# Patient Record
Sex: Female | Born: 1965
Health system: Southern US, Community
[De-identification: ages and names within clinical notes are randomized; demographics above are authoritative.]

## PROBLEM LIST (undated history)

## (undated) DIAGNOSIS — J302 Other seasonal allergic rhinitis: Secondary | ICD-10-CM

## (undated) DIAGNOSIS — T4145XA Adverse effect of unspecified anesthetic, initial encounter: Secondary | ICD-10-CM

## (undated) DIAGNOSIS — I499 Cardiac arrhythmia, unspecified: Secondary | ICD-10-CM

---

## 1985-07-17 HISTORY — PX: OTHER SURGICAL HISTORY: SHX169

## 1993-07-17 DIAGNOSIS — T8859XA Other complications of anesthesia, initial encounter: Secondary | ICD-10-CM

## 1993-07-17 HISTORY — DX: Other complications of anesthesia, initial encounter: T88.59XA

## 1993-07-17 HISTORY — PX: MANDIBLE FRACTURE SURGERY: SHX706

## 1999-06-13 ENCOUNTER — Encounter: Payer: Self-pay | Admitting: Sports Medicine

## 1999-06-13 ENCOUNTER — Encounter: Admission: RE | Admit: 1999-06-13 | Discharge: 1999-06-13 | Payer: Self-pay | Admitting: Sports Medicine

## 1999-06-22 ENCOUNTER — Encounter: Payer: Self-pay | Admitting: Sports Medicine

## 1999-06-22 ENCOUNTER — Encounter: Admission: RE | Admit: 1999-06-22 | Discharge: 1999-06-22 | Payer: Self-pay | Admitting: Sports Medicine

## 1999-12-01 ENCOUNTER — Encounter: Payer: Self-pay | Admitting: Sports Medicine

## 1999-12-01 ENCOUNTER — Encounter: Admission: RE | Admit: 1999-12-01 | Discharge: 1999-12-01 | Payer: Self-pay | Admitting: Sports Medicine

## 1999-12-22 ENCOUNTER — Encounter: Admission: RE | Admit: 1999-12-22 | Discharge: 1999-12-22 | Payer: Self-pay | Admitting: Family Medicine

## 1999-12-22 ENCOUNTER — Encounter: Payer: Self-pay | Admitting: Family Medicine

## 2000-07-03 ENCOUNTER — Encounter: Admission: RE | Admit: 2000-07-03 | Discharge: 2000-07-03 | Payer: Self-pay | Admitting: Family Medicine

## 2000-07-03 ENCOUNTER — Encounter: Payer: Self-pay | Admitting: Family Medicine

## 2001-04-12 ENCOUNTER — Encounter: Admission: RE | Admit: 2001-04-12 | Discharge: 2001-04-12 | Payer: Self-pay

## 2002-06-16 ENCOUNTER — Encounter: Payer: Self-pay | Admitting: Sports Medicine

## 2002-06-16 ENCOUNTER — Encounter: Admission: RE | Admit: 2002-06-16 | Discharge: 2002-06-16 | Payer: Self-pay | Admitting: Sports Medicine

## 2002-06-16 ENCOUNTER — Encounter: Admission: RE | Admit: 2002-06-16 | Discharge: 2002-06-16 | Payer: Self-pay | Admitting: Family Medicine

## 2003-08-26 ENCOUNTER — Encounter: Admission: RE | Admit: 2003-08-26 | Discharge: 2003-08-26 | Payer: Self-pay | Admitting: Sports Medicine

## 2004-12-22 ENCOUNTER — Encounter: Admission: RE | Admit: 2004-12-22 | Discharge: 2004-12-22 | Payer: Self-pay | Admitting: Sports Medicine

## 2004-12-29 ENCOUNTER — Encounter: Admission: RE | Admit: 2004-12-29 | Discharge: 2004-12-29 | Payer: Self-pay | Admitting: Sports Medicine

## 2006-01-02 ENCOUNTER — Encounter: Admission: RE | Admit: 2006-01-02 | Discharge: 2006-01-02 | Payer: Self-pay | Admitting: Obstetrics and Gynecology

## 2007-01-04 ENCOUNTER — Encounter: Admission: RE | Admit: 2007-01-04 | Discharge: 2007-01-04 | Payer: Self-pay | Admitting: Obstetrics and Gynecology

## 2007-05-20 ENCOUNTER — Encounter: Payer: Self-pay | Admitting: Sports Medicine

## 2007-05-20 LAB — CONVERTED CEMR LAB
Alkaline Phosphatase: 54 units/L (ref 39–117)
Chloride: 105 meq/L (ref 96–112)
Cholesterol: 178 mg/dL (ref 0–200)
Hemoglobin: 13.8 g/dL (ref 12.0–15.0)
LDL Cholesterol: 105 mg/dL — ABNORMAL HIGH (ref 0–99)
MCV: 93.3 fL (ref 78.0–100.0)
Platelets: 210 10*3/uL (ref 150–400)
Potassium: 4.2 meq/L (ref 3.5–5.3)
RBC: 4.64 M/uL (ref 3.87–5.11)
RDW: 13.3 % (ref 11.5–14.0)
Sodium: 138 meq/L (ref 135–145)
TSH: 2.594 microintl units/mL (ref 0.350–5.50)
Total CHOL/HDL Ratio: 3.3
Triglycerides: 97 mg/dL (ref <150)
VLDL: 19 mg/dL (ref 0–40)

## 2007-06-05 ENCOUNTER — Emergency Department (HOSPITAL_COMMUNITY): Admission: EM | Admit: 2007-06-05 | Discharge: 2007-06-05 | Payer: Self-pay | Admitting: Emergency Medicine

## 2007-07-18 DIAGNOSIS — I499 Cardiac arrhythmia, unspecified: Secondary | ICD-10-CM

## 2007-07-18 HISTORY — DX: Cardiac arrhythmia, unspecified: I49.9

## 2007-08-01 ENCOUNTER — Ambulatory Visit (HOSPITAL_COMMUNITY): Admission: RE | Admit: 2007-08-01 | Discharge: 2007-08-01 | Payer: Self-pay | Admitting: Cardiology

## 2007-08-01 ENCOUNTER — Encounter (INDEPENDENT_AMBULATORY_CARE_PROVIDER_SITE_OTHER): Payer: Self-pay | Admitting: Cardiology

## 2008-02-11 ENCOUNTER — Encounter: Admission: RE | Admit: 2008-02-11 | Discharge: 2008-02-11 | Payer: Self-pay | Admitting: Obstetrics and Gynecology

## 2009-02-11 ENCOUNTER — Encounter: Admission: RE | Admit: 2009-02-11 | Discharge: 2009-02-11 | Payer: Self-pay | Admitting: Obstetrics and Gynecology

## 2009-03-03 ENCOUNTER — Ambulatory Visit: Payer: Self-pay | Admitting: Sports Medicine

## 2009-03-03 DIAGNOSIS — M5412 Radiculopathy, cervical region: Secondary | ICD-10-CM | POA: Insufficient documentation

## 2009-12-06 ENCOUNTER — Ambulatory Visit: Payer: Self-pay | Admitting: Family Medicine

## 2009-12-06 DIAGNOSIS — M1220 Villonodular synovitis (pigmented), unspecified site: Secondary | ICD-10-CM | POA: Insufficient documentation

## 2009-12-06 DIAGNOSIS — M25569 Pain in unspecified knee: Secondary | ICD-10-CM | POA: Insufficient documentation

## 2010-02-17 ENCOUNTER — Encounter: Admission: RE | Admit: 2010-02-17 | Discharge: 2010-02-17 | Payer: Self-pay | Admitting: Obstetrics and Gynecology

## 2010-06-14 ENCOUNTER — Ambulatory Visit: Payer: Self-pay | Admitting: Sports Medicine

## 2010-06-14 DIAGNOSIS — M7710 Lateral epicondylitis, unspecified elbow: Secondary | ICD-10-CM | POA: Insufficient documentation

## 2010-06-14 DIAGNOSIS — M25529 Pain in unspecified elbow: Secondary | ICD-10-CM

## 2010-08-07 ENCOUNTER — Encounter: Payer: Self-pay | Admitting: Sports Medicine

## 2010-08-16 NOTE — Assessment & Plan Note (Signed)
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 Sulfa  Social History: Smoking Status:  never  Physical Exam  General:  Well-developed,well-nourished,in no acute distress; alert,appropriate and cooperative throughout examination Msk:  TTP at RT lat epicondlyle this is reproduced by reisted 3rd fing ext resisted ext of all finges less by wrist ext  able to hold mod bookd straight out pain on heavy book at str arm position Additional Exam:  MSK [place] There is edema over ext tendons insertion to lat epicondyle on RT no tears noted in tendons no abnorm doppler flow   Impression & Recommendations:  Problem # 1:  ELBOW PAIN, RIGHT (ICD-719.42)  Orders: Pneumatic Arm Band (N8295) [place] LIMITED ([id number])   trial w topical meds for this to see if adequate for pain  does not appear related to neck  good neck ROM today  Problem # 2:  LATERAL EPICONDYLITIS, RIGHT (ICD-726.32)  clasic findinsgs for tennis elbow begin rehab  begin conservative care  Orders: [place] LIMITED ([id number])  Complete Medication List: 1)  Gabapentin 600 Mg Tabs (Gabapentin) .Marland Kitchen.. 1 by mouth tid 2)  Tramadol Hcl 50 Mg Tabs (Tramadol hcl) .Marland Kitchen.. 1 by mouth qid as needed pain 3)  Voltaren 1 % Gel (Diclofenac sodium) .... Apply 1/2 gram 4 times per day  Patient Instructions: 1)  Wear tennis  elbow strap esp with typing 2)  Starting with 1 lb wt do exercises- wrist down slow then up fast, over slow back fast- gradually increase to 3-5 lb wt 3)  Use voltaren gel- massage into elbow 4)  quick ice massage- do not do ice pack on elbow 5)  follow up in 1 month Prescriptions: VOLTAREN 1 % GEL (DICLOFENAC SODIUM) Apply 1/2 gram 4 times per day  #200 grams x PRN   Entered by:   Rochele Pages RN   Authorized by:   Enid Baas MD   Signed by:   Rochele Pages RN on 06/14/2010   Method used:   Electronically to        [facility]* (retail)       [address]       [address] Shipping/mailing       [place], [place]  [address]       Ph: [phone]       Fax: [phone]   RxID:   [id number]    Orders Added: 1)  Pneumatic Arm Band [L3999] 2)  Est. Patient Level III [64332] 3)  [place] LIMITED [[id number]]

## 2010-08-16 NOTE — Assessment & Plan Note (Signed)
Summary: 1:30-RT KNEE PER NEETON,MC   Vital Signs:  Patient profile:   45 year old female BP sitting:   117 / 81  Vitals Entered By: Lillia Pauls CMA (Dec 06, 2009 1:55 PM)  History of Present Illness: DATE of INJURY (approximate): 12/04/2009 saturday she was seated on stool and had severe knee pain, 10/10 when she tried to stand up. was unable to fully straighten leg for about 20 minutes secondaryto pain. Was a bit sore that day and has progressively improved since. Still has a focal area of TTp. had no signs prior to this incident. has noted no locking catching or swelling of the knee. No leg or calf pain. No recent injury or new activity.  PERTINENT PMH/PSH: 5 excosion of pigmented villonodular synovitis no opther surgery or injury to right knee   Past History:  Past Surgical History: excision righ knee synovium (focal0  for pigemnted villonodular synovitis 1987 [place])  Review of Systems  The patient denies anorexia, fever, weight loss, and weight gain.    Physical Exam  General:  alert and well-developed.   Msk:  R knee has well healed vertical scar lateral patellar area, well healed  about 2.5 cmlong. This is directly over area that is TTP which is dime sized. no nodularity is noted.  Patella tracks normally. No effusion or ballottment.  SKIN around knee iswithout erythema or warmth KNEE full flexion and extension that ispainless. Ligamentously intact to varus/valgus stress. M=Nomral LACHMAN and anterior drawer. Popliateal space is soft, calf is soft. distally she is neuorvascularly intact with 2+ DP pulse and normal sensation to soft touch  [place] Limited. No effusion seen in suprapatellar pouch. Synovium looks normal on lateral and medial sides of the joint with no masses or fluid noted. b menisci appear intact. patellar and quadricep tendon intact with no sign of tendinopathy.    Impression & Recommendations:  Problem # 1:  KNEE PAIN, RIGHT, ACUTE  (ICD-719.46) Acute knee pain in area right under her scar from previous excision pigemnted villonodular synovitis.Marland Kitchen this may be re-occrrence or it may be related to adhesion from the scar. definitive dx would probably required MRI. i di do an [place] today to evaluate the synovium and did not see anything abnormal but sensitivity of this exam is low. We discussed options--MRI vs watch and wait--she opted to watch. if pain recurres or new signs, she will call and we will order MRI w contrast.  Problem # 2:  Hx of PIGMENTED VILLONODULAR SYNOVITIS (ICD-719.20)  Complete Medication List: 1)  Gabapentin 600 Mg Tabs (Gabapentin) .Marland Kitchen.. 1 by mouth tid 2)  Tramadol Hcl 50 Mg Tabs (Tramadol hcl) .Marland Kitchen.. 1 by mouth qid as needed pain

## 2010-11-08 ENCOUNTER — Other Ambulatory Visit: Payer: Self-pay | Admitting: *Deleted

## 2010-11-08 MED ORDER — FLUTICASONE PROPIONATE 50 MCG/ACT NA SUSP: 1.0000 | Freq: Every day | NASAL | Status: DC

## 2010-11-08 MED ORDER — AMOXICILLIN 500 MG PO TABS: 500.0000 mg | ORAL_TABLET | Freq: Three times a day (TID) | ORAL | Status: AC

## 2010-11-08 MED ORDER — AMOXICILLIN 500 MG PO TABS: 500.0000 mg | ORAL_TABLET | Freq: Three times a day (TID) | ORAL | Status: DC

## 2011-01-31 ENCOUNTER — Other Ambulatory Visit: Payer: Self-pay | Admitting: Obstetrics and Gynecology

## 2011-01-31 DIAGNOSIS — Z1231 Encounter for screening mammogram for malignant neoplasm of breast: Secondary | ICD-10-CM

## 2011-02-09 ENCOUNTER — Encounter: Payer: Self-pay | Admitting: Cardiology

## 2011-02-15 ENCOUNTER — Ambulatory Visit: Payer: Self-pay | Admitting: Cardiology

## 2011-03-02 ENCOUNTER — Ambulatory Visit
Admission: RE | Admit: 2011-03-02 | Discharge: 2011-03-02 | Disposition: A | Discharge status: Home / Self Care | Payer: 59 | Source: Ambulatory Visit | Attending: Obstetrics and Gynecology | Admitting: Obstetrics and Gynecology

## 2011-03-02 DIAGNOSIS — Z1231 Encounter for screening mammogram for malignant neoplasm of breast: Secondary | ICD-10-CM

## 2011-04-20 ENCOUNTER — Other Ambulatory Visit: Payer: Self-pay | Admitting: Family Medicine

## 2011-04-20 MED ORDER — FLUTICASONE PROPIONATE 50 MCG/ACT NA SUSP: 1.0000 | Freq: Every day | NASAL | Status: DC

## 2011-04-25 LAB — COMPREHENSIVE METABOLIC PANEL
ALT: 12
AST: 18
Alkaline Phosphatase: 50
BUN: 9
CO2: 20
Chloride: 100
Creatinine, Ser: 0.89
GFR calc Af Amer: >60
Glucose, Bld: 137 — ABNORMAL HIGH
Sodium: 132 — ABNORMAL LOW
Total Protein: 7.2

## 2011-04-25 LAB — CBC
HCT: 39.8
MCV: 90.9

## 2011-04-25 LAB — POCT CARDIAC MARKERS
CKMB, poc: <1 — ABNORMAL LOW
Myoglobin, poc: 58
Myoglobin, poc: 68.1
Operator id: 288331
Operator id: 288331
Troponin i, poc: <0.05

## 2011-04-25 LAB — DIFFERENTIAL
Basophils Absolute: 0
Monocytes Absolute: 0.5
Monocytes Relative: 9
Neutrophils Relative %: 51

## 2011-04-25 LAB — MAGNESIUM: Magnesium: 2

## 2011-05-18 ENCOUNTER — Other Ambulatory Visit: Payer: Self-pay | Admitting: Family Medicine

## 2011-05-18 DIAGNOSIS — J329 Chronic sinusitis, unspecified: Secondary | ICD-10-CM

## 2011-05-19 ENCOUNTER — Ambulatory Visit
Admission: RE | Admit: 2011-05-19 | Discharge: 2011-05-19 | Disposition: A | Discharge status: Home / Self Care | Payer: 59 | Source: Ambulatory Visit | Attending: Family Medicine | Admitting: Family Medicine

## 2011-05-19 DIAGNOSIS — J329 Chronic sinusitis, unspecified: Secondary | ICD-10-CM

## 2011-08-21 ENCOUNTER — Ambulatory Visit (INDEPENDENT_AMBULATORY_CARE_PROVIDER_SITE_OTHER): Payer: 59 | Admitting: Family Medicine

## 2011-08-21 VITALS — BP 120/62

## 2011-08-21 DIAGNOSIS — M25569 Pain in unspecified knee: Secondary | ICD-10-CM

## 2011-08-21 DIAGNOSIS — M1220 Villonodular synovitis (pigmented), unspecified site: Secondary | ICD-10-CM

## 2011-08-21 NOTE — Progress Notes (Signed)
  Subjective:    Patient ID: Crystal Ortiz, female    DOB: 09-17-1965, 46 y.o.   MRN: [id number]  HPI  Right lateral knee pain for about the last 2 weeks, gradually worsening. No specific injury. He feels full and still particular she tries to bend it fully. No falls, no locking. Occasionally feels popping sensation laterally. PERTINENT  PMH / PSH: 1987 resection of pigmented villonodular synovitis: Dr. Vedia Coffer [facility] [place]   Review of Systems    Denies fever, sweats, chills. No redness or warmth of the right knee joint. It does feel swollen.  Objective:   Physical Exam Vital signs reviewed. GENERAL: Well developed, well nourished, no acute distress KNEE: Right. Well-healed scar superior lateral quadrant. Mildly tender to palpation of the scar. There is no true joint line tenderness. Small effusion is present. Flexion and extension are full. Ligamentously intact to varus and valgus stress.  ULTRASOUND: Small amount of fluid in the suprapatellar pouch most notable on the lateral side. There is also a focal area of either calcification, as her tissue or other mass seen within the suprapatellar pouch. Ultrasound images are saved for documentation. The medial and lateral meniscus are both intact. The patellar quadriceps tendons are intact.       Assessment & Plan:   Knee pain in a patient with history of pigmented villonodular synovitis that was resected in 1987. When I saw her last several months ago the ultrasound did not show any effusion. A little bit concerned about this mass [place] in the suprapatellar pouch. Most likely it is a calcification. Given her history we will get MRI.

## 2011-08-24 ENCOUNTER — Ambulatory Visit (HOSPITAL_COMMUNITY)
Admission: RE | Admit: 2011-08-24 | Discharge: 2011-08-24 | Disposition: A | Discharge status: Home / Self Care | Payer: 59 | Source: Ambulatory Visit | Attending: Family Medicine | Admitting: Family Medicine

## 2011-08-24 ENCOUNTER — Telehealth: Payer: Self-pay | Admitting: Family Medicine

## 2011-08-24 DIAGNOSIS — M9420 Chondromalacia, unspecified site: Secondary | ICD-10-CM | POA: Insufficient documentation

## 2011-08-24 DIAGNOSIS — M25569 Pain in unspecified knee: Secondary | ICD-10-CM | POA: Insufficient documentation

## 2011-08-24 DIAGNOSIS — M7120 Synovial cyst of popliteal space [Baker], unspecified knee: Secondary | ICD-10-CM | POA: Insufficient documentation

## 2011-08-24 DIAGNOSIS — M899 Disorder of bone, unspecified: Secondary | ICD-10-CM | POA: Insufficient documentation

## 2011-08-24 DIAGNOSIS — M25469 Effusion, unspecified knee: Secondary | ICD-10-CM | POA: Insufficient documentation

## 2011-08-24 DIAGNOSIS — M949 Disorder of cartilage, unspecified: Secondary | ICD-10-CM | POA: Insufficient documentation

## 2011-08-24 NOTE — Telephone Encounter (Signed)
Discussed her MRI with her. Definitely a fair amount of effusion and some chondral lesions. I am still not totally convinced that there is no recurrence of her prior issue with pigmented villonodular synovitis. I looked at the MRI but I don't see anything specific. I really think she should have an orthopedist see her. I discussed this with her by phone and she is going to consider who she will see.. If she needs [place] to set up a referral she will call a period she will let me know ultimately which he is decided

## 2011-08-29 ENCOUNTER — Telehealth: Payer: Self-pay | Admitting: *Deleted

## 2011-08-29 NOTE — Telephone Encounter (Signed)
Scheduled pt for appt for eval of rt knee with Dr. Jerl Santos 09/04/11 @ 4:30pm.  Pt notified of appt info.  Office notes faxed.

## 2011-11-13 ENCOUNTER — Encounter (HOSPITAL_COMMUNITY): Payer: Self-pay

## 2011-11-13 ENCOUNTER — Emergency Department (HOSPITAL_COMMUNITY)
Admission: EM | Admit: 2011-11-13 | Discharge: 2011-11-13 | Disposition: A | Discharge status: Home / Self Care | Payer: 59 | Source: Home / Self Care | Attending: Emergency Medicine | Admitting: Emergency Medicine

## 2011-11-13 DIAGNOSIS — K130 Diseases of lips: Secondary | ICD-10-CM

## 2011-11-13 DIAGNOSIS — R220 Localized swelling, mass and lump, head: Secondary | ICD-10-CM

## 2011-11-13 HISTORY — DX: Other seasonal allergic rhinitis: J30.2

## 2011-11-13 MED ORDER — ACYCLOVIR 400 MG PO TABS: 400.0000 mg | ORAL_TABLET | Freq: Three times a day (TID) | ORAL | Status: DC

## 2011-11-13 MED ORDER — ACYCLOVIR 400 MG PO TABS: 400.0000 mg | ORAL_TABLET | Freq: Three times a day (TID) | ORAL | Status: AC

## 2011-11-13 NOTE — ED Provider Notes (Signed)
History     CSN: [id number]  Arrival date & time 11/13/11  0848   First MD Initiated Contact with Patient 11/13/11 1010      Chief Complaint  Patient presents with  . Oral Swelling    (Consider location/radiation/quality/duration/timing/severity/associated sxs/prior treatment) HPI Comments: swelling to upper and lower lips- states they felt dry and tingly yesterday and she awakened during the night and lips were swollen.  States she feels like she may be getting oral blister, since she was waiting to be seen today. She also described that maybe on a previous occasion long time ago she experienced a similar event in which he had some blisters on her lips felt some of normal sensations.  Patient also describes a maybe about 2 weeks ago she did feel some numbness around her lips but that seem to faded away on its own no further symptoms she had at that time. She also adds that she has felt her allergies to be a little bit more intense or exacerbated "runny and congested nose seem to be worse or not this has anything to do with it".  Patient denies any headache, eye pain or changes in vision, no further symptoms to suggest neurological symptomatology. Denies fevers as well.  Patient is a 46 y.o. female presenting with mouth sores. The history is provided by the patient.  Mouth Lesions  The current episode started yesterday. The problem occurs occasionally. The problem has been gradually worsening. The problem is moderate. The symptoms are aggravated by nothing. Associated symptoms include congestion, mouth sores and rhinorrhea. Pertinent negatives include no fever, no eye itching, no ear pain, no headaches, no hearing loss, no sore throat, no stridor, no swollen glands, no neck stiffness, no cough, no wheezing, no eye discharge, no eye pain and no eye redness.    Past Medical History  Diagnosis Date  . Seasonal allergies     Past Surgical History  Procedure Date  . Exicision right knee  synovium 1987    HP; for pigmented villonodular synovitis     No family history on file.  History  Substance Use Topics  . Smoking status: Current Everyday Smoker  . Smokeless tobacco: Not on file  . Alcohol Use: No    OB History    Grav Para Term Preterm Abortions TAB SAB Ect Mult Living                  Review of Systems  Constitutional: Negative for fever, chills, activity change and appetite change.  HENT: Positive for congestion, rhinorrhea and mouth sores. Negative for hearing loss, ear pain and sore throat.   Eyes: Negative for pain, discharge, redness and itching.  Respiratory: Negative for cough, wheezing and stridor.   Neurological: Negative for headaches.    Allergies  Sulfonamide derivatives  Home Medications   Current Outpatient Rx  Name Route Sig Dispense Refill  . CETIRIZINE HCL 10 MG PO TABS Oral Take 10 mg by mouth daily.    Marland Kitchen FLUTICASONE PROPIONATE 50 MCG/ACT NA SUSP Nasal Place 1 spray into the nose daily. 90 Act 6  . GABAPENTIN 600 MG PO TABS Oral Take 600 mg by mouth 3 (three) times daily.      Marland Kitchen OXYMETAZOLINE HCL 0.05 % NA SOLN Nasal Place 2 sprays into the nose 2 (two) times daily.    . ACYCLOVIR 400 MG PO TABS Oral Take 1 tablet (400 mg total) by mouth 3 (three) times daily. 15 tablet 1  . DICLOFENAC SODIUM  1 % TD GEL  Apply 1/2 gram 4 times per day     . TRAMADOL HCL 50 MG PO TABS Oral Take 50 mg by mouth 4 (four) times daily as needed. For pain       BP 126/79  Pulse 72  Temp(Src) 98 F (36.7 C) (Oral)  Resp 16  SpO2 100%  Physical Exam  Nursing note and vitals reviewed. Constitutional: She appears well-developed and well-nourished. No distress.  HENT:  Head: Normocephalic.  Right Ear: Tympanic membrane normal.  Left Ear: Tympanic membrane normal.  Nose: Nose normal.  Mouth/Throat: Uvula is midline. No oropharyngeal exudate, posterior oropharyngeal edema, posterior oropharyngeal erythema or tonsillar abscesses.    Eyes:  Conjunctivae are normal. Right eye exhibits no discharge. Left eye exhibits no discharge. No scleral icterus.  Neck: Neck supple.  Lymphadenopathy:    She has no cervical adenopathy.  Skin: Skin is warm. No rash noted. No erythema.    ED Course  Procedures (including critical care time)   Labs Reviewed  HERPES SIMPLEX VIRUS CULTURE   No results found.   1. Lip swelling       MDM  Patient has an ulcerative type lesion that is starting to form a fascicular structure in the lower lip haven't taking a sample for cultures as patient has never been diagnosed with herpes labialis before, discussed with patient's 2 most likely diagnoses included herpes type I and ulcerative lesions related to aphthous ulcers. Patient agree to be cultured and prefers to be treated empirically prior culture results. Agreed with treatment plan and followup care as necessary or if any further changes. Patient is not on any ACE inhibitors and has not been started on any new medications to entertain angioedema as a potential source. Patient has no other oral or intraoral for visual swelling and uvula looks completely normal        Jimmie Molly, MD 11/13/11 1101

## 2011-11-13 NOTE — Discharge Instructions (Signed)
We discuss the likelihood that this lesion and swelling is related to a HSV Type-1 infection versus aphthous ulcers. We will contact you only if abnormal culture results.

## 2011-11-13 NOTE — ED Notes (Signed)
C/o swelling to upper and lower lips- states they felt dry and tingly yesterday and she awakened during the night and lips were swollen.  States she feels like she may be getting oral blister.

## 2011-11-15 LAB — HERPES SIMPLEX VIRUS CULTURE: Culture: NOT DETECTED

## 2012-02-19 ENCOUNTER — Other Ambulatory Visit: Payer: Self-pay | Admitting: Obstetrics and Gynecology

## 2012-02-19 DIAGNOSIS — Z1231 Encounter for screening mammogram for malignant neoplasm of breast: Secondary | ICD-10-CM

## 2012-03-07 ENCOUNTER — Ambulatory Visit
Admission: RE | Admit: 2012-03-07 | Discharge: 2012-03-07 | Disposition: A | Discharge status: Home / Self Care | Payer: 59 | Source: Ambulatory Visit | Attending: Obstetrics and Gynecology | Admitting: Obstetrics and Gynecology

## 2012-03-07 DIAGNOSIS — Z1231 Encounter for screening mammogram for malignant neoplasm of breast: Secondary | ICD-10-CM

## 2013-03-05 ENCOUNTER — Other Ambulatory Visit: Payer: Self-pay

## 2013-03-05 DIAGNOSIS — Z1231 Encounter for screening mammogram for malignant neoplasm of breast: Secondary | ICD-10-CM

## 2013-03-14 ENCOUNTER — Ambulatory Visit
Admission: RE | Admit: 2013-03-14 | Discharge: 2013-03-14 | Disposition: A | Discharge status: Home / Self Care | Payer: 59 | Source: Ambulatory Visit

## 2013-03-14 DIAGNOSIS — Z1231 Encounter for screening mammogram for malignant neoplasm of breast: Secondary | ICD-10-CM

## 2013-04-29 ENCOUNTER — Encounter (INDEPENDENT_AMBULATORY_CARE_PROVIDER_SITE_OTHER): Payer: Self-pay

## 2013-05-06 ENCOUNTER — Other Ambulatory Visit: Payer: Self-pay | Admitting: *Deleted

## 2013-05-06 MED ORDER — FLUTICASONE PROPIONATE 50 MCG/ACT NA SUSP: 2.0000 | Freq: Every day | NASAL | Status: DC

## 2013-05-22 ENCOUNTER — Other Ambulatory Visit: Payer: Self-pay

## 2014-02-12 ENCOUNTER — Other Ambulatory Visit: Payer: Self-pay

## 2014-02-12 DIAGNOSIS — Z1231 Encounter for screening mammogram for malignant neoplasm of breast: Secondary | ICD-10-CM

## 2014-03-16 ENCOUNTER — Ambulatory Visit
Admission: RE | Admit: 2014-03-16 | Discharge: 2014-03-16 | Disposition: A | Discharge status: Home / Self Care | Payer: 59 | Source: Ambulatory Visit

## 2014-03-16 DIAGNOSIS — Z1231 Encounter for screening mammogram for malignant neoplasm of breast: Secondary | ICD-10-CM

## 2014-06-09 ENCOUNTER — Ambulatory Visit (INDEPENDENT_AMBULATORY_CARE_PROVIDER_SITE_OTHER): Payer: 59 | Admitting: Sports Medicine

## 2014-06-09 ENCOUNTER — Encounter: Payer: Self-pay | Admitting: Sports Medicine

## 2014-06-09 VITALS — BP 105/71 | HR 66 | Ht 68.5 in | Wt 175.0 lb

## 2014-06-09 DIAGNOSIS — M4120 Other idiopathic scoliosis, site unspecified: Secondary | ICD-10-CM | POA: Insufficient documentation

## 2014-06-09 DIAGNOSIS — M2170 Unequal limb length (acquired), unspecified site: Secondary | ICD-10-CM | POA: Insufficient documentation

## 2014-06-09 DIAGNOSIS — M533 Sacrococcygeal disorders, not elsewhere classified: Secondary | ICD-10-CM | POA: Insufficient documentation

## 2014-06-09 NOTE — Patient Instructions (Signed)
Left leg is very short - use lift even in regular shoes Scoliosis with concave curve to left makes this worse  For back - Rt side bend holding weights RT side rotation Lt elbow to RT knee  For SI Joint Pretzel stretch - try to do 3 a day at least for 20 to 30 secs Standing hip rotations do 3 sets of 10 daily Lateral leg lifts - 3 sets of 15 Knee to chest Knee to opposite shoulder Pelvic tilts  OK to use gabapentin if helping  Aleve is good as needed

## 2014-06-09 NOTE — Assessment & Plan Note (Signed)
This is giving her a significant Trendelenburg gait that is probably worsening the SI joint symptoms  She was given felt lifts to use in multiple shoes  When she wears this that corrects a lot of the Trendelenburg

## 2014-06-09 NOTE — Assessment & Plan Note (Signed)
Since she has strength and good SI joint motion I think we should focus on some stretches, exercises and correcting the leg length inequality  Okay to use Aleve  If she continued to have too much pain we can consider injection but primarily for pain relief  I don't think manipulation would help as much because her mobility is good

## 2014-06-09 NOTE — Assessment & Plan Note (Signed)
I think this is contributing to some of the SI joint pain because of an increase shift of her trunk to the left  Exercises suggested on a daily basis

## 2014-06-09 NOTE — Progress Notes (Signed)
Patient ID: Crystal Ortiz, female   DOB: 18-May-1966, 48 y.o.   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 Years ago I treated her with some correction of her leg length inequality and with some exercises to lessen sacroiliac joint dysfunction When she does the exercises she does seem to function better She continues to use a lift and sports shoes but has not been wearing one at work She knows that she had scoliosis in her adolescent years  For the past 2 months she said a gradual increase in left low back and hip pain For the past week it's been fairly severe making her very stiff in the mornings and gradually improving later in the day Aleve and ibuprofen helps somewhat Stretching helps somewhat No acute injury occurred during this time  Her other history is documented in the chart and reviewed  Physical examination General a pleasant female in no acute distress BP 105/71 mmHg  Pulse 66  Ht 5' 8.5" (1.74 m)  Wt 175 lb (79.379 kg)  BMI 26.22 kg/m2  Hip range of motion is normal bilaterally but she does have some increase in internal rotation Leg length revealed that the left is about 2 cm short Standing she has a concave curve to the left With walking gait and this creates a Trendelenburg shift and leaning of the trunk over the left SI joint  Today SI joint is mobile on manipulation but slightly tighter on the left Pretzel stretch his reflex on the right and slightly tighter on the left Abduction strength and rotation strength are very good  Standing lumbar flexion does show good movement in the SI joint

## 2014-09-16 ENCOUNTER — Other Ambulatory Visit: Payer: Self-pay | Admitting: *Deleted

## 2014-09-16 MED ORDER — FLUTICASONE PROPIONATE 50 MCG/ACT NA SUSP: 2.0000 | Freq: Every day | NASAL | Status: DC

## 2015-03-10 ENCOUNTER — Other Ambulatory Visit: Payer: Self-pay | Admitting: Family Medicine

## 2015-03-10 ENCOUNTER — Other Ambulatory Visit (HOSPITAL_COMMUNITY)
Admission: RE | Admit: 2015-03-10 | Discharge: 2015-03-10 | Disposition: A | Discharge status: Home / Self Care | Payer: 59 | Source: Ambulatory Visit | Attending: Family Medicine | Admitting: Family Medicine

## 2015-03-10 DIAGNOSIS — Z1151 Encounter for screening for human papillomavirus (HPV): Secondary | ICD-10-CM | POA: Insufficient documentation

## 2015-03-10 DIAGNOSIS — Z01419 Encounter for gynecological examination (general) (routine) without abnormal findings: Secondary | ICD-10-CM | POA: Insufficient documentation

## 2015-03-11 ENCOUNTER — Other Ambulatory Visit: Payer: Self-pay

## 2015-03-11 DIAGNOSIS — Z1231 Encounter for screening mammogram for malignant neoplasm of breast: Secondary | ICD-10-CM

## 2015-03-11 LAB — CYTOLOGY - PAP

## 2015-03-23 ENCOUNTER — Ambulatory Visit: Payer: 59

## 2015-03-25 ENCOUNTER — Ambulatory Visit
Admission: RE | Admit: 2015-03-25 | Discharge: 2015-03-25 | Disposition: A | Discharge status: Home / Self Care | Payer: 59 | Source: Ambulatory Visit

## 2015-03-25 DIAGNOSIS — Z1231 Encounter for screening mammogram for malignant neoplasm of breast: Secondary | ICD-10-CM

## 2015-10-19 MED FILL — GABAPENTIN 300 MG CAPSULE: 300 | 90 days supply | Qty: 90 | Fill #1

## 2016-01-06 MED FILL — GABAPENTIN 300 MG CAPSULE: 300 | 90 days supply | Qty: 180 | Fill #0

## 2016-03-28 ENCOUNTER — Encounter: Payer: Self-pay | Admitting: Sports Medicine

## 2016-03-28 ENCOUNTER — Ambulatory Visit (INDEPENDENT_AMBULATORY_CARE_PROVIDER_SITE_OTHER): Payer: 59 | Admitting: Sports Medicine

## 2016-03-28 ENCOUNTER — Other Ambulatory Visit (INDEPENDENT_AMBULATORY_CARE_PROVIDER_SITE_OTHER): Payer: 59

## 2016-03-28 VITALS — BP 110/80 | Ht 68.5 in | Wt 170.0 lb

## 2016-03-28 DIAGNOSIS — M25562 Pain in left knee: Secondary | ICD-10-CM | POA: Diagnosis not present

## 2016-03-28 DIAGNOSIS — M2170 Unequal limb length (acquired), unspecified site: Secondary | ICD-10-CM | POA: Diagnosis not present

## 2016-03-28 DIAGNOSIS — M25552 Pain in left hip: Secondary | ICD-10-CM

## 2016-03-28 DIAGNOSIS — M533 Sacrococcygeal disorders, not elsewhere classified: Secondary | ICD-10-CM

## 2016-03-28 NOTE — Assessment & Plan Note (Signed)
She has a 1 inch leg length difference  I think we need to put in a custom orthotic since I cannot correct this with simple pads

## 2016-03-28 NOTE — Assessment & Plan Note (Signed)
I think this is bio-mechanical from her shorter leg  We will see if pain resolves with custom orthotics

## 2016-03-28 NOTE — Progress Notes (Signed)
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 leg length difference We have added lifts to her shoes for the shorter left leg At first but has helped her left SI joint pain She will periodic flares and do stretches and exercises  She started a running school to get in better shape Now she is having more significant left SI joint pain She is also getting some medial knee pain  Past history Right knee: pigmented villonodular synovitis removed at surgery  Review of systems No sciatic No locking No buckling No swelling in the knee  Physical examination Pleasant female in no acute distress/ standing shows that the left shoulder since lower/ trunk is shifted to the left BP 110/80   Ht 5' 8.5" (1.74 m)   Wt 170 lb (77.1 kg)   BMI 25.47 kg/m   Left Knee Knee: Normal to inspection with no erythema or effusion or obvious bony abnormalities. Palpation normal with no warmth or joint line tenderness or patellar tenderness or condyle tenderness. ROM normal in flexion and extension and lower leg rotation. Ligaments with solid consistent endpoints including ACL, PCL, LCL, MCL. Negative Mcmurray's and provocative meniscal tests. Non painful patellar compression. Patellar and quadriceps tendons unremarkable. Hamstring and quadriceps strength is normal.  Left hip joint shows normal range of motion slightly tighter than the right Hip muscle strength testing is normal Left SI joint is tender to palpation FABER is tight on the left Shows less motion of the left SI joint  Left leg is 2.5 cm shorter  Ultrasound of Knee-left  Patellar tendon - nl Quad tendon - nl Suprapatellar pouch effusion-none Medial meniscus-nl; Lateral meniscus_nl  Summary: Normal [place] of Left Knee

## 2016-03-28 NOTE — Assessment & Plan Note (Signed)
Continue doing exercises and stretches I think we have to do more correction for her leg length if she is going to do running  If this gets severe we can do injection Ibuprofen when necessary

## 2016-03-29 ENCOUNTER — Ambulatory Visit: Payer: 59 | Admitting: Sports Medicine

## 2016-04-04 ENCOUNTER — Encounter: Payer: 59 | Admitting: Family Medicine

## 2016-04-04 ENCOUNTER — Ambulatory Visit (INDEPENDENT_AMBULATORY_CARE_PROVIDER_SITE_OTHER): Payer: 59 | Admitting: Sports Medicine

## 2016-04-04 DIAGNOSIS — M533 Sacrococcygeal disorders, not elsewhere classified: Secondary | ICD-10-CM | POA: Diagnosis not present

## 2016-04-04 DIAGNOSIS — R269 Unspecified abnormalities of gait and mobility: Secondary | ICD-10-CM

## 2016-04-04 DIAGNOSIS — M25562 Pain in left knee: Secondary | ICD-10-CM | POA: Diagnosis not present

## 2016-04-05 DIAGNOSIS — R269 Unspecified abnormalities of gait and mobility: Secondary | ICD-10-CM | POA: Insufficient documentation

## 2016-04-05 NOTE — Assessment & Plan Note (Signed)
This is also improved today and the SI joint seemed more mobile

## 2016-04-05 NOTE — Assessment & Plan Note (Signed)
Patient was fitted for a : standard, cushioned, semi-rigid orthotic. The orthotic was heated and afterward the patient stood on the orthotic blank positioned on the orthotic stand. The patient was positioned in subtalar neutral position and 10 degrees of ankle dorsiflexion in a weight bearing stance. After completion of molding, a stable base was applied to the orthotic blank. The blank was ground to a stable position for weight bearing. Size: 9 red EVA Base: blue med EVA left/ blue foam RT Posting: heel pad left Additional orthotic padding: none  Placed base of the orthotic to correct over half of the leg length difference on the left  Post orthotic gait revealed that the heel lift eliminated the left shift of her trunk and it eliminated her hip Trendelenburg shift; she was comfortable walking in these corrections.  I spent 40 minutes face-to-face with this patient evaluating and preparing orthotics. 50% of the time was spent counseling on avoiding recurrent injury to the left extremity and low back.

## 2016-04-05 NOTE — Progress Notes (Signed)
Chief complaint; left lower extremity pain and leg length inequality  Patient was seen in September 12 with both left SI joint and left knee pain She had started a running fitness program The pain in both the knee and the SI joint area progress until it became too severe to run or walk comfortably  She has some known scoliosis In the past we had placed her in orthotics to correct a Trendelenburg gait caused by significant leg length difference  On last visit the leg length difference was measured as 2.5 cm with the left being shorter   Her gait showed a significant shift to the left sideand a Trendelenburg of the left hip  For these reasons we scheduled her for custom orthotics  Physical exam Pleasant female in no acute distress Please review specific findings from September 12 BP 118/60   Left knee exam repeated  Normal to inspection with no erythema or effusion or obvious bony abnormalities. Palpation normal with no warmth or joint line tenderness or patellar tenderness or condyle tenderness. ROM normal in flexion and extension and lower leg rotation. Ligaments with solid consistent endpoints including ACL, PCL, LCL, MCL. Negative Mcmurray's and provocative meniscal tests. Non painful patellar compression. Patellar and quadriceps tendons unremarkable. Hamstring and quadriceps strength is normal.  Mild scoliosis  Pre- orthotic gait Significant Trendelenburg to the left Upper trunk shift to the left

## 2016-04-05 NOTE — Assessment & Plan Note (Signed)
Improved and a repeat examination did not reveal any pathology

## 2016-04-12 MED FILL — GABAPENTIN 300 MG CAPSULE: 300 | 90 days supply | Qty: 180 | Fill #1

## 2016-04-13 ENCOUNTER — Other Ambulatory Visit: Payer: Self-pay | Admitting: Family Medicine

## 2016-04-13 DIAGNOSIS — Z1231 Encounter for screening mammogram for malignant neoplasm of breast: Secondary | ICD-10-CM

## 2016-04-19 DIAGNOSIS — M25562 Pain in left knee: Secondary | ICD-10-CM | POA: Diagnosis not present

## 2016-04-20 ENCOUNTER — Ambulatory Visit
Admission: RE | Admit: 2016-04-20 | Discharge: 2016-04-20 | Disposition: A | Discharge status: Home / Self Care | Payer: 59 | Source: Ambulatory Visit | Attending: Family Medicine | Admitting: Family Medicine

## 2016-04-20 DIAGNOSIS — Z1231 Encounter for screening mammogram for malignant neoplasm of breast: Secondary | ICD-10-CM

## 2016-04-27 ENCOUNTER — Ambulatory Visit: Payer: 59 | Attending: Orthopaedic Surgery | Admitting: Physical Therapy

## 2016-04-27 DIAGNOSIS — M6281 Muscle weakness (generalized): Secondary | ICD-10-CM | POA: Insufficient documentation

## 2016-04-27 DIAGNOSIS — R293 Abnormal posture: Secondary | ICD-10-CM | POA: Insufficient documentation

## 2016-04-27 DIAGNOSIS — M25652 Stiffness of left hip, not elsewhere classified: Secondary | ICD-10-CM | POA: Diagnosis not present

## 2016-04-27 DIAGNOSIS — M25562 Pain in left knee: Secondary | ICD-10-CM | POA: Insufficient documentation

## 2016-04-27 NOTE — Patient Instructions (Signed)
  Abduction: Side Leg Lift (Eccentric) - Side-Lying    Lie on side. Lift top leg slightly higher than shoulder level. Keep top leg straight with body, toes pointing forward. Slowly lower for 3-5 seconds. __10-20_ reps per set, __1_ sets per day, __5_ days per week.  http://ecce.exer.us/63   Copyright  VHI. All rights reserved.   Outer Hip Stretch: Reclined IT Band Stretch (Strap)    Strap around opposite foot, pull across only as far as possible with shoulders on mat. Hold for __30 sec __ breaths. Repeat _2-3___ times each leg.  Copyright  VHI. All rights reserved.  Hamstring Stretch, Reclined (Strap, Doorframe)    Lengthen bottom leg on floor. Extend top leg along edge of doorframe or press foot up into yoga strap. Hold for __30 sec __ breaths. Repeat __2-3__ times each leg.  Copyright  VHI. All rights reserved.  Hip Flexors (Supine)    Lie with both legs bent over edge of firm surface. To stretch left hip, bring opposite knee to chest. Apply downward pressure to leg hanging over edge. Do not allow hips to roll up. Do not let knees change position. Hold __30__ seconds. Repeat __2-3__ times. Do __2__ sessions per day. CAUTION: Stretch should be gentle, steady and slow.  Copyright  VHI. All rights reserved.

## 2016-04-27 NOTE — Therapy (Signed)
Crystal Ortiz, [place], [address] Phone: [phone]   Fax:  [phone]  Physical Therapy Evaluation  Patient Details  Name: Crystal Ortiz MRN: [id number] Date of Birth: January 20, 1966 Referring Provider: Dr. Rhona Raider   Encounter Date: 04/27/2016      PT End of Session - 04/27/16 1431    Visit Number 1   Number of Visits 6   Date for PT Re-Evaluation 06/08/16   PT Start Time 1336   PT Stop Time 1425   PT Time Calculation (min) 49 min   Activity Tolerance Patient tolerated treatment well   Behavior During Therapy [facility] for tasks assessed/performed      Past Medical History:  Diagnosis Date  . Seasonal allergies     Past Surgical History:  Procedure Laterality Date  . exicision right knee synovium  1987   HP; for pigmented villonodular synovitis     There were no vitals filed for this visit.       Subjective Assessment - 04/27/16 1341    Subjective Pt reports for L knee pain for about 6 weeks.  LLE is shorter.  Given insert about 3-4 weeks ago.  Fields thought more mech.  Saw Dalldorf for cont pain  L SI joint has been inc past 2 days.  Pain moves.  Endorses crepitus at times. Weakness.  Some lateral shin pain improved.     Limitations Walking;House hold activities;Other (comment);Sitting  stairs, squatting, running    How long can you sit comfortably? pain with sitting at work    How long can you walk comfortably? 30 or more min with min pain    Diagnostic tests [place] normal. XR was normal.     Patient Stated Goals Patient would like to be able to do normal activity.    Currently in Pain? Yes   Pain Score 1    Pain Location Knee   Pain Orientation Left   Pain Descriptors / Indicators Discomfort;Aching   Pain Type Acute pain   Pain Onset More than a month ago   Pain Frequency Intermittent   Aggravating Factors  walking, 2 days after running , sitting too long at work desk (is OK at home)    Pain  Relieving Factors Aleve, ice, RICE , salonpas    Multiple Pain Sites Yes   Pain Score 6   Pain Location Back  SIJ    Pain Orientation Left   Pain Type Chronic pain   Pain Radiating Towards sometimes to groin, thigh    Pain Onset More than a month ago  2 mos    Pain Frequency Intermittent            [facility] PT Assessment - 04/27/16 1359      Assessment   Medical Diagnosis L knee pain (PFPS)   Referring Provider Dr. Rhona Raider    Onset Date/Surgical Date 03/16/16  6 weeks per pt report)   Next MD Visit Unknown   Prior Therapy yes for R.t knee post surgery     Precautions   Precautions None     Restrictions   Weight Bearing Restrictions No     Balance Screen   Has the patient fallen in the past 6 months No     [place] residence     Prior Function   Level of Independence Independent     Cognition   Overall Cognitive Status Within Functional Limits for tasks assessed  Observation/Other Assessments   Focus on Therapeutic Outcomes (FOTO)  28%     Sensation   Light Touch Appears Intact     Coordination   Gross Motor Movements are Fluid and Coordinated Not tested     Functional Tests   Functional tests Squat     Squat   Comments min pain, less when hips abd      Step Up   Comments 4 inch no pain      Step Down   Comments 4 inch min pain and knee adducts, both LE impaired in fact Rt. LE was more difficult     Single Leg Stance   Comments WFL      Posture/Postural Control   Posture/Postural Control Postural limitations   Postural Limitations Left pelvic obliquity   Posture Comments LLE shorter, L shoulder lower and upper trunk rotated L      AROM   Right Knee Extension 0   Right Knee Flexion 140   Left Knee Extension 4   Left Knee Flexion 130  pain ant      Strength   Right Hip Flexion 5/5   Right Hip Extension 4+/5   Right Hip ABduction 5/5   Left Hip Flexion 5/5   Left Hip Extension 4/5   Left Hip  ABduction 4/5   Right Knee Flexion 5/5   Right Knee Extension 4+/5   Left Knee Flexion 5/5   Left Knee Extension 5/5     Flexibility   Soft Tissue Assessment /Muscle Length --  tight L ITB    Hamstrings 75-85 deg bilat.    Quadriceps tight     Palpation   Patella mobility good    Palpation comment divet distal quad      Special Tests   Hip Special Tests  [facility] Test    Findings Negative   Side --  bilat.    Comments --  tight quads despite neg test, L thigh drifts lateral =ITB     Ambulation/Gait   Gait Comments leg length apparent- leans to L to compensate                            PT Education - 04/27/16 1430    Education provided Yes   Education Details PT/POC, HEP , alignment and balance of pelvis , LE as it relates to running    Person(s) Educated Patient   Methods Explanation;Verbal cues;Handout   Comprehension Verbalized understanding;Need further instruction          PT Short Term Goals - 04/27/16 1455      PT SHORT TERM GOAL #1   Title STG=LTG           PT Long Term Goals - 04/27/16 1450      PT LONG TERM GOAL #1   Title Pt will be I with initial HEP   Time 6   Period Weeks   Status New     PT LONG TERM GOAL #2   Title Pt will be able to perform single leg step downs with better LE alignment (6 inch step or more) to demo improved pelvic stability.    Time 6   Period Weeks   Status New     PT LONG TERM GOAL #3   Title Pt will be able to squat without pain increase in knees >75% of the time.    Time 6   Period Weeks  Status New     PT LONG TERM GOAL #4   Title Pt will be able to descend stairs reciprocally without UE assist and improved confidence, no pain (12 steps)    Time 6   Period Weeks   Status New               Plan - 04/27/16 1434    Clinical Impression Statement Pt presents with low complexity eval of L knee pain which has interfered with her ability to maintain her physical  fitness routine, walk and negotiate stairs.  She has a significant leg length discrepancy (L leg shorter).  Her pain was exacerbated with new fitness routine of running.  She has decent strength but hip/pelvic imbalance which causes increased lateral stress on her L knee.  She will benefit from PT for corrective exercises and HEP to restore and improve full function.    Rehab Potential Excellent   PT Frequency 1x / week   PT Duration 6 weeks  6 visits if needed    PT Treatment/Interventions ADLs/Self Care Home Management;Therapeutic activities;Dry needling;Taping;Therapeutic exercise;Ultrasound;Neuromuscular re-education;Cryotherapy;Functional mobility training;Passive range of motion;Patient/family education;Manual techniques;Electrical Stimulation;Iontophoresis 4mg /ml Dexamethasone;Balance training   PT Next Visit Plan review HEP and add if appropriate (glute med, bridge?) try Mcconnell tape   PT Home Exercise Plan ITB, hamstring and ant hip stretch, hip abd strength (side)   Consulted and Agree with Plan of Care Patient      Patient will benefit from skilled therapeutic intervention in order to improve the following deficits and impairments:  Decreased range of motion, Difficulty walking, Increased fascial restricitons, Postural dysfunction, Impaired sensation, Decreased mobility, Impaired flexibility, Pain  Visit Diagnosis: Muscle weakness (generalized)  Stiffness of left hip, not elsewhere classified  Acute pain of left knee  Abnormal posture     Problem List Patient Active Problem List   Diagnosis Date Noted  . Abnormality of gait 04/05/2016  . Knee pain, left 03/28/2016  . Idiopathic scoliosis 06/09/2014  . Leg length inequality 06/09/2014  . Sacroiliac joint dysfunction of left side 06/09/2014  . ELBOW PAIN, RIGHT 06/14/2010  . LATERAL EPICONDYLITIS, RIGHT 06/14/2010  . PIGMENTED VILLONODULAR SYNOVITIS 12/06/2009  . KNEE PAIN, RIGHT, ACUTE 12/06/2009  . CERVICAL  RADICULOPATHY 03/03/2009    PAA,JENNIFER 04/27/2016, 2:55 PM  [facility] [address] [place], [place], [address] Phone: [phone]   Fax:  [phone]  Name: SATINE BELLINA MRN: [id number] Date of Birth: 1965/11/27  Raeford Razor, PT 04/27/16 2:56 PM Phone: [phone] Fax: [phone]

## 2016-05-04 ENCOUNTER — Ambulatory Visit: Payer: 59 | Admitting: Physical Therapy

## 2016-05-04 DIAGNOSIS — M6281 Muscle weakness (generalized): Secondary | ICD-10-CM

## 2016-05-04 DIAGNOSIS — R293 Abnormal posture: Secondary | ICD-10-CM

## 2016-05-04 DIAGNOSIS — M25562 Pain in left knee: Secondary | ICD-10-CM

## 2016-05-04 DIAGNOSIS — M25652 Stiffness of left hip, not elsewhere classified: Secondary | ICD-10-CM

## 2016-05-04 NOTE — Therapy (Signed)
[place], [place], [address] Phone: [phone]   Fax:  [phone]  Physical Therapy Treatment  Patient Details  Name: Crystal Ortiz MRN: [id number] Date of Birth: [birth year] Referring Provider: Dr. Rhona Raider   Encounter Date: 05/04/2016      PT End of Session - 05/04/16 1427    Visit Number 2   Number of Visits 6   Date for PT Re-Evaluation 06/08/16   PT Start Time 1332   PT Stop Time 1419   PT Time Calculation (min) 47 min   Activity Tolerance Patient tolerated treatment well   Behavior During Therapy [facility] for tasks assessed/performed      Past Medical History:  Diagnosis Date  . Seasonal allergies     Past Surgical History:  Procedure Laterality Date  . exicision right knee synovium  1987   HP; for pigmented villonodular synovitis     There were no vitals filed for this visit.      Subjective Assessment - 05/04/16 1336    Subjective L knee pain incr , no pain during 5K but now its M/L .  I might try and run today.    Currently in Pain? Yes   Pain Score 4    Pain Location Knee   Pain Orientation Left   Pain Descriptors / Indicators Discomfort;Aching   Pain Type Acute pain   Pain Onset More than a month ago   Pain Frequency Intermittent   Pain Relieving Factors salonpas, aleve, ice and also if you walk a little it goes away.            [place] Adult PT Treatment/Exercise - 05/04/16 1343      Knee/Hip Exercises: Stretches   Active Hamstring Stretch Left;2 reps;30 seconds   ITB Stretch 2 reps;30 seconds     Knee/Hip Exercises: Machines for Product/process development scientist Reformer: sidelying leg press out (single leg) parallel and hip ER , cues for pelvic stability     Knee/Hip Exercises: Standing   Hip Abduction Stengthening;Both;1 set;10 reps     Knee/Hip Exercises: Supine   Bridges Strengthening;1 set;10 reps   Bridges with [employer] Strengthening;Both;1 set;5 reps   hold with knee ext    Single Leg Bridge Strengthening;Both;1 set;10 reps   Straight Leg Raise with External Rotation Strengthening;Both;1 set;10 reps   Straight Leg Raise with External Rotation Limitations cues to adduct and full extend knee      Knee/Hip Exercises: Sidelying   Hip ABduction Strengthening;Left;1 set;10 reps     Manual Therapy   McConnell 2 strips to pull patella medially.                  PT Education - 05/04/16 1417    Education provided Yes   Education Details alignment, PIlates, Theatre stage manager) Educated Patient   Methods Explanation   Comprehension Verbalized understanding;Returned demonstration          PT Short Term Goals - 04/27/16 1455      PT SHORT TERM GOAL #1   Title STG=LTG           PT Long Term Goals - 05/04/16 1430      PT LONG TERM GOAL #1   Title Pt will be I with initial HEP   Status Achieved     PT LONG TERM GOAL #2   Title Pt will be able to perform single leg step downs with better LE alignment (6  inch step or more) to demo improved pelvic stability.    Status On-going     PT LONG TERM GOAL #3   Title Pt will be able to squat without pain increase in knees >75% of the time.    Status On-going     PT LONG TERM GOAL #4   Title Pt will be able to descend stairs reciprocally without UE assist and improved confidence, no pain (12 steps)    Status On-going               Plan - 05/04/16 1427    Clinical Impression Statement Pt able to run 5K and have 4 days without pain.  Pain today medial>lateral, knee felt more stable with walking and tape applied.  Weak core evident wth pelvic stabilization exercises.     PT Next Visit Plan assess tape, cont with bridge for pelvic stab, Reformer for alignment, stretch hips   PT Home Exercise Plan ITB, hamstring and ant hip stretch, hip abd strength (side), bridge with march , knee ext    Consulted and Agree with Plan of Care Patient      Patient will benefit  from skilled therapeutic intervention in order to improve the following deficits and impairments:  Decreased range of motion, Difficulty walking, Increased fascial restricitons, Postural dysfunction, Impaired sensation, Decreased mobility, Impaired flexibility, Pain  Visit Diagnosis: Muscle weakness (generalized)  Stiffness of left hip, not elsewhere classified  Acute pain of left knee  Abnormal posture     Problem List Patient Active Problem List   Diagnosis Date Noted  . Abnormality of gait 04/05/2016  . Knee pain, left 03/28/2016  . Idiopathic scoliosis 06/09/2014  . Leg length inequality 06/09/2014  . Sacroiliac joint dysfunction of left side 06/09/2014  . ELBOW PAIN, RIGHT 06/14/2010  . LATERAL EPICONDYLITIS, RIGHT 06/14/2010  . PIGMENTED VILLONODULAR SYNOVITIS 12/06/2009  . KNEE PAIN, RIGHT, ACUTE 12/06/2009  . CERVICAL RADICULOPATHY 03/03/2009    Maxmillian Carsey 05/04/2016, 2:33 PM  [facility] [address] [place], [place], [address] Phone: [phone]   Fax:  [phone]  Name: CHEVETTE DANNELLY MRN: [id number] Date of Birth: 09-Jun-1966   Raeford Razor, PT 05/04/16 2:34 PM Phone: [phone] Fax: [phone]

## 2016-05-11 ENCOUNTER — Ambulatory Visit: Payer: 59 | Admitting: Physical Therapy

## 2016-05-11 DIAGNOSIS — R293 Abnormal posture: Secondary | ICD-10-CM

## 2016-05-11 DIAGNOSIS — M25562 Pain in left knee: Secondary | ICD-10-CM

## 2016-05-11 DIAGNOSIS — M6281 Muscle weakness (generalized): Secondary | ICD-10-CM

## 2016-05-11 DIAGNOSIS — M25652 Stiffness of left hip, not elsewhere classified: Secondary | ICD-10-CM

## 2016-05-11 NOTE — Therapy (Signed)
[place], [place], [address] Phone: [phone]   Fax:  [phone]  Physical Therapy Treatment  Patient Details  Name: Crystal Ortiz MRN: [id number] Date of Birth: April 10, 1966 Referring Provider: Dr. Rhona Raider   Encounter Date: 05/11/2016      PT End of Session - 05/11/16 1350    Visit Number 3   Number of Visits 6   Date for PT Re-Evaluation 06/08/16   PT Start Time 1330   PT Stop Time 1419   PT Time Calculation (min) 49 min   Activity Tolerance Patient tolerated treatment well   Behavior During Therapy [facility] for tasks assessed/performed      Past Medical History:  Diagnosis Date  . Seasonal allergies     Past Surgical History:  Procedure Laterality Date  . exicision right knee synovium  1987   HP; for pigmented villonodular synovitis     There were no vitals filed for this visit.      Subjective Assessment - 05/11/16 1334    Subjective Ran twice, didn't hurt.  Tape helped. Put my knee on the ground to get up from the floor last night and that hurt.     Currently in Pain? Yes   Pain Score 1    Pain Location Knee   Pain Orientation Right   Pain Descriptors / Indicators Discomfort   Pain Type Chronic pain   Pain Onset More than a month ago   Pain Frequency Intermittent            [facility] Adult PT Treatment/Exercise - 05/11/16 1347      Knee/Hip Exercises: Machines for Strengthening   Other Machine Pilates Tower: see note      Knee/Hip Exercises: Standing   Functional Squat 2 sets   Functional Squat Limitations used [place] near [employer], iso hold for row x 10    Other Standing Knee Exercises single leg sit to stand each leg   from [employer] (high surface)    Other Standing Knee Exercises focus control of lowering      Manual Therapy   McConnell 2 strips to pull patella medially.           Pilates Tower for LE/Core strength, postural strength, lumbopelvic disassociation and  core control.  Exercises included: Supine Leg [place] single leg 1 yellow, arcs and circles  Double leg arcs and squat, scissors Sidelying Leg [place] adduction, abd, sidekick and circles        PT Education - 05/11/16 1426    Education provided Yes   Education Details Pilates Tower    Person(s) Educated Patient   Methods Explanation   Comprehension Verbalized understanding          PT Short Term Goals - 04/27/16 1455      PT SHORT TERM GOAL #1   Title STG=LTG           PT Long Term Goals - 05/11/16 1354      PT LONG TERM GOAL #1   Title Pt will be I with initial HEP   Status Achieved     PT LONG TERM GOAL #2   Title Pt will be able to perform single leg step downs with better LE alignment (6 inch step or more) to demo improved pelvic stability.    Status On-going     PT LONG TERM GOAL #3   Title Pt will be able to squat without pain increase in knees >75% of the time.  Status Partially Met     PT LONG TERM GOAL #4   Title Pt will be able to descend stairs reciprocally without UE assist and improved confidence, no pain (12 steps)    Status On-going               Plan - 05/11/16 1426    Clinical Impression Statement Pt doing well.  She had medial knee pain with standing ther ex, but tolerable.  Crepitus with squats.  She wants to continue running until early Dec. for a 5 K but will probably not cont beyond that.  Explained stress of running on joints and importance to be fit enough to run with respect ot alignment and core.  Rt. leg had more difficulty with sit to stand and SLB work than L.     PT Next Visit Plan cont pelvic stability, check step downs,  VMO, check bridge HEP , tape    PT Home Exercise Plan ITB, hamstring and ant hip stretch, hip abd strength (side), bridge with march , knee ext ,single leg sit to stand high surface   Consulted and Agree with Plan of Care Patient      Patient will benefit from skilled therapeutic intervention in order  to improve the following deficits and impairments:  Decreased range of motion, Difficulty walking, Increased fascial restricitons, Postural dysfunction, Impaired sensation, Decreased mobility, Impaired flexibility, Pain  Visit Diagnosis: Muscle weakness (generalized)  Stiffness of left hip, not elsewhere classified  Acute pain of left knee  Abnormal posture     Problem List Patient Active Problem List   Diagnosis Date Noted  . Abnormality of gait 04/05/2016  . Knee pain, left 03/28/2016  . Idiopathic scoliosis 06/09/2014  . Leg length inequality 06/09/2014  . Sacroiliac joint dysfunction of left side 06/09/2014  . ELBOW PAIN, RIGHT 06/14/2010  . LATERAL EPICONDYLITIS, RIGHT 06/14/2010  . PIGMENTED VILLONODULAR SYNOVITIS 12/06/2009  . KNEE PAIN, RIGHT, ACUTE 12/06/2009  . CERVICAL RADICULOPATHY 03/03/2009    PAA,JENNIFER 05/11/2016, 2:35 PM  [facility] [address] [place], [place], [address] Phone: [phone]   Fax:  [phone]  Name: Crystal Ortiz MRN: [id number] Date of Birth: 17-Jan-1966   Raeford Razor, PT 05/11/16 2:36 PM Phone: [phone] Fax: [phone]

## 2016-05-18 ENCOUNTER — Ambulatory Visit: Payer: 59 | Attending: Orthopaedic Surgery | Admitting: Physical Therapy

## 2016-05-18 DIAGNOSIS — R293 Abnormal posture: Secondary | ICD-10-CM | POA: Diagnosis not present

## 2016-05-18 DIAGNOSIS — M6281 Muscle weakness (generalized): Secondary | ICD-10-CM | POA: Diagnosis not present

## 2016-05-18 DIAGNOSIS — M25562 Pain in left knee: Secondary | ICD-10-CM | POA: Insufficient documentation

## 2016-05-18 DIAGNOSIS — M25652 Stiffness of left hip, not elsewhere classified: Secondary | ICD-10-CM | POA: Insufficient documentation

## 2016-05-18 NOTE — Therapy (Signed)
[place], [place], [address] Phone: [phone]   Fax:  [phone]  Physical Therapy Treatment  Patient Details  Name: Crystal Ortiz MRN: [id number] Date of Birth: [birth year] Referring Provider: Dr. Rhona Raider   Encounter Date: 05/18/2016      PT End of Session - 05/18/16 1400    Visit Number 4   Number of Visits 6   Date for PT Re-Evaluation 06/08/16   PT Start Time 1340   PT Stop Time 1430   PT Time Calculation (min) 50 min   Activity Tolerance Patient tolerated treatment well   Behavior During Therapy [facility] for tasks assessed/performed      Past Medical History:  Diagnosis Date  . Seasonal allergies     Past Surgical History:  Procedure Laterality Date  . exicision right knee synovium  1987   HP; for pigmented villonodular synovitis     There were no vitals filed for this visit.      Subjective Assessment - 05/18/16 1342    Subjective I'm about the same.  Pain when I twist (foot on ground), or cross my L knee over my Rt.  Ran last night.  Does not hurt when I run.  Feel pulling after walking or sitting for awhile. The first 20-30 paces hurt then its OK.  Bent down to tie my shoe and it felt like my knee was going to explode.    Currently in Pain? No/denies            [facility] PT Assessment - 05/18/16 1432      AROM   Left Knee Flexion 135     other    Findings Positive  Clarke's test   Side  Left     Apley's Compression   Findings Negative   Side  Left                     [facility] Adult PT Treatment/Exercise - 05/18/16 1348      Knee/Hip Exercises: Supine   Bridges Strengthening;1 set;10 reps   Bridges with [employer] Strengthening;Both;1 set;5 reps  hold with knee ext    Single Leg Bridge Strengthening;Both;1 set;10 reps   Straight Leg Raises Strengthening;Left;1 set   Straight Leg Raise with External Rotation Strengthening;Both;1 set;10 reps   Straight Leg  Raise with External Rotation Limitations cues to adduct and full extend knee    Other Supine Knee/Hip Exercises 4 way SLR      Knee/Hip Exercises: Sidelying   Hip ABduction Strengthening;Both;1 set;15 reps   Hip ADduction Strengthening;Both;1 set;15 reps   Other Sidelying Knee/Hip Exercises prone bent knee raise x 10      Manual Therapy   Manual Therapy Soft tissue mobilization   Manual therapy comments quad stretch manual    Soft tissue mobilization quads lateral and ITB , IASTM    McConnell 2 strips to pull patella medially.         See self care          PT Education - 05/18/16 1432    Education provided Yes   Education Details soft tissue work, foam rolling, Engineer, drilling for [employer]) Educated Patient   Methods Demonstration;Explanation   Comprehension Verbalized understanding;Returned demonstration          PT Short Term Goals - 04/27/16 1455      PT SHORT TERM GOAL #1   Title STG=LTG  PT Long Term Goals - 05/18/16 1435      PT LONG TERM GOAL #1   Title Pt will be I with initial HEP   Status Achieved     PT LONG TERM GOAL #2   Title Pt will be able to perform single leg step downs with better LE alignment (6 inch step or more) to demo improved pelvic stability.    Status On-going     PT LONG TERM GOAL #3   Title Pt will be able to squat without pain increase in knees >75% of the time.    Status On-going     PT LONG TERM GOAL #4   Title Pt will be able to descend stairs reciprocally without UE assist and improved confidence, no pain (12 steps)    Status Partially Met               Plan - 05/18/16 1433    Clinical Impression Statement pt cont to have mild pain in L knee.  Weak medial knee, adductors and VMO on L side.  ITB tight bilateral and painful with IASTM.  Rt quads tighter than L. Could be more consistent with HEP. Intends to buy Leukotape for L knee.     PT Next Visit Plan cont pelvic stability, check step  downs,  VMO, check in with HEP  , tape and try soft foam rolling ITB and quads    PT Home Exercise Plan ITB, hamstring and ant hip stretch, hip abd strength (side), bridge with march , knee ext ,single leg sit to stand high surface   Consulted and Agree with Plan of Care Patient      Patient will benefit from skilled therapeutic intervention in order to improve the following deficits and impairments:  Decreased range of motion, Difficulty walking, Increased fascial restricitons, Postural dysfunction, Impaired sensation, Decreased mobility, Impaired flexibility, Pain  Visit Diagnosis: Muscle weakness (generalized)  Stiffness of left hip, not elsewhere classified  Abnormal posture  Acute pain of left knee     Problem List Patient Active Problem List   Diagnosis Date Noted  . Abnormality of gait 04/05/2016  . Knee pain, left 03/28/2016  . Idiopathic scoliosis 06/09/2014  . Leg length inequality 06/09/2014  . Sacroiliac joint dysfunction of left side 06/09/2014  . ELBOW PAIN, RIGHT 06/14/2010  . LATERAL EPICONDYLITIS, RIGHT 06/14/2010  . PIGMENTED VILLONODULAR SYNOVITIS 12/06/2009  . KNEE PAIN, RIGHT, ACUTE 12/06/2009  . CERVICAL RADICULOPATHY 03/03/2009    Brenlee Koskela 05/18/2016, 2:38 PM  [facility] [address] [place], [place], [address] Phone: [phone]   Fax:  [phone]  Name: TAVI GAUGHRAN MRN: [id number] Date of Birth: 1965/10/29   Raeford Razor, PT 05/18/16 2:38 PM Phone: [phone] Fax: [phone]

## 2016-05-25 ENCOUNTER — Ambulatory Visit: Payer: 59 | Admitting: Physical Therapy

## 2016-05-25 DIAGNOSIS — M6281 Muscle weakness (generalized): Secondary | ICD-10-CM

## 2016-05-25 DIAGNOSIS — M25652 Stiffness of left hip, not elsewhere classified: Secondary | ICD-10-CM | POA: Diagnosis not present

## 2016-05-25 DIAGNOSIS — R293 Abnormal posture: Secondary | ICD-10-CM

## 2016-05-25 DIAGNOSIS — M25562 Pain in left knee: Secondary | ICD-10-CM

## 2016-05-25 NOTE — Therapy (Addendum)
[place], [place], [address] Phone: [phone]   Fax:  [phone]  Physical Therapy Treatment/Discharge   Patient Details  Name: Crystal Ortiz MRN: [id number] Date of Birth: 02-17-1966 Referring Provider: Dr. Rhona Raider   Encounter Date: 05/25/2016      PT End of Session - 05/25/16 1537    Visit Number 5   Number of Visits 6   Date for PT Re-Evaluation 06/08/16   PT Start Time 1332   PT Stop Time 1420   PT Time Calculation (min) 48 min   Activity Tolerance Patient tolerated treatment well   Behavior During Therapy [facility] for tasks assessed/performed      Past Medical History:  Diagnosis Date  . Seasonal allergies     Past Surgical History:  Procedure Laterality Date  . exicision right knee synovium  1987   HP; for pigmented villonodular synovitis     There were no vitals filed for this visit.      Subjective Assessment - 05/25/16 1344    Subjective Can we do that massage again? I think that really helped.     Currently in Pain? No/denies             [facility] Adult PT Treatment/Exercise - 05/25/16 1341      Knee/Hip Exercises: Aerobic   Elliptical level 1 resist, level 10 ramp     Knee/Hip Exercises: Standing   Hip Abduction Stengthening;Both;1 set;15 reps   Lateral Step Up Left;2 sets;10 reps   Lateral Step Up Limitations 4 inch and 6 inch    Forward Step Up Left;2 sets;10 reps;Hand Hold: 0   Forward Step Up Limitations 4 inch and 8 inch      Iontophoresis   Type of Iontophoresis Dexamethasone   Location L medial knee    Dose 1 cc    Time 6 hour patch      Manual Therapy   Manual Therapy Soft tissue mobilization   Manual therapy comments quad stretch manual    Soft tissue mobilization quads lateral and ITB , IASTM                 PT Education - 05/25/16 1537    Education provided Yes   Education Details discussed her history of PVNS   Person(s) Educated Patient    Methods Explanation;Demonstration   Comprehension Verbalized understanding          PT Short Term Goals - 04/27/16 1455      PT SHORT TERM GOAL #1   Title STG=LTG           PT Long Term Goals - 05/25/16 1539      PT LONG TERM GOAL #1   Title Pt will be I with initial HEP   Status Achieved     PT LONG TERM GOAL #2   Title Pt will be able to perform single leg step downs with better LE alignment (6 inch step or more) to demo improved pelvic stability.    Baseline improving , difficulty over 6 inch    Status On-going     PT LONG TERM GOAL #3   Title Pt will be able to squat without pain increase in knees >75% of the time.    Status On-going     PT LONG TERM GOAL #4   Title Pt will be able to descend stairs reciprocally without UE assist and improved confidence, no pain (12 steps)    Status Partially Met  Plan - 05/25/16 1358    Clinical Impression Statement Pt reports 80% improvement in pain.  USed ot have pain even at rest, now only with initial motion and transitioning from walk to run, sit to stand and pivoting on her knee. Repeat of soft tissue to Lateral L thigh release ITB and trial ionto for inflammation.    PT Next Visit Plan cont pelvic stability, check step downs,  VMO, check in with HEP  , tape and try soft foam rolling ITB and quads    PT Home Exercise Plan ITB, hamstring and ant hip stretch, hip abd strength (side), bridge with march , knee ext ,single leg sit to stand high surface   Consulted and Agree with Plan of Care Patient      Patient will benefit from skilled therapeutic intervention in order to improve the following deficits and impairments:  Decreased range of motion, Difficulty walking, Increased fascial restricitons, Postural dysfunction, Impaired sensation, Decreased mobility, Impaired flexibility, Pain  Visit Diagnosis: Muscle weakness (generalized)  Stiffness of left hip, not elsewhere classified  Abnormal  posture  Acute pain of left knee     Problem List Patient Active Problem List   Diagnosis Date Noted  . Abnormality of gait 04/05/2016  . Knee pain, left 03/28/2016  . Idiopathic scoliosis 06/09/2014  . Leg length inequality 06/09/2014  . Sacroiliac joint dysfunction of left side 06/09/2014  . ELBOW PAIN, RIGHT 06/14/2010  . LATERAL EPICONDYLITIS, RIGHT 06/14/2010  . PIGMENTED VILLONODULAR SYNOVITIS 12/06/2009  . KNEE PAIN, RIGHT, ACUTE 12/06/2009  . CERVICAL RADICULOPATHY 03/03/2009    Cash Duce 05/25/2016, 3:43 PM  [facility] [address] [place], [place], [address] Phone: [phone]   Fax:  [phone]  Name: Crystal Ortiz MRN: [id number] Date of Birth: 11/25/1965  Raeford Razor, PT 05/25/16 3:43 PM Phone: [phone] Fax: [phone]  PHYSICAL THERAPY DISCHARGE SUMMARY  Visits from Start of Care: 5  Current functional level related to goals / functional outcomes: See above for most recent info    Remaining deficits: Unknown as she has not returned in several weeks.    Education / Equipment: HEP, alignment, Pilates  Plan: Patient agrees to discharge.  Patient goals were partially met. Patient is being discharged due to not returning since the last visit.  ?????     Raeford Razor, PT 07/18/16 10:44 AM Phone: [phone] Fax: [phone]

## 2016-05-29 ENCOUNTER — Ambulatory Visit: Payer: 59 | Admitting: Physical Therapy

## 2016-08-29 DIAGNOSIS — R21 Rash and other nonspecific skin eruption: Secondary | ICD-10-CM | POA: Diagnosis not present

## 2016-09-13 MED FILL — GABAPENTIN 300 MG CAPSULE: 300 | 90 days supply | Qty: 180 | Fill #0

## 2016-09-18 MED FILL — OSELTAMIVIR PHOSPHATE 75 MG: 75 | 10 days supply | Qty: 10 | Fill #0

## 2016-09-23 ENCOUNTER — Telehealth: Payer: 59 | Admitting: Nurse Practitioner

## 2016-09-23 DIAGNOSIS — R05 Cough: Secondary | ICD-10-CM | POA: Diagnosis not present

## 2016-09-23 DIAGNOSIS — J208 Acute bronchitis due to other specified organisms: Secondary | ICD-10-CM | POA: Diagnosis not present

## 2016-09-23 DIAGNOSIS — R059 Cough, unspecified: Secondary | ICD-10-CM

## 2016-09-23 MED ORDER — AZITHROMYCIN 250 MG PO TABS: ORAL_TABLET | ORAL | 0 refills | Status: DC

## 2016-09-23 MED ORDER — BENZONATATE 100 MG PO CAPS: 100.0000 mg | ORAL_CAPSULE | Freq: Three times a day (TID) | ORAL | 0 refills | Status: DC | PRN

## 2016-09-23 NOTE — Progress Notes (Signed)

## 2016-09-27 DIAGNOSIS — R05 Cough: Secondary | ICD-10-CM | POA: Diagnosis not present

## 2016-09-27 DIAGNOSIS — R062 Wheezing: Secondary | ICD-10-CM | POA: Diagnosis not present

## 2016-09-27 DIAGNOSIS — J209 Acute bronchitis, unspecified: Secondary | ICD-10-CM | POA: Diagnosis not present

## 2016-10-06 DIAGNOSIS — L309 Dermatitis, unspecified: Secondary | ICD-10-CM | POA: Diagnosis not present

## 2016-10-06 DIAGNOSIS — R208 Other disturbances of skin sensation: Secondary | ICD-10-CM | POA: Diagnosis not present

## 2016-12-21 ENCOUNTER — Ambulatory Visit: Payer: Self-pay

## 2016-12-21 ENCOUNTER — Encounter: Payer: Self-pay | Admitting: Sports Medicine

## 2016-12-21 ENCOUNTER — Ambulatory Visit (INDEPENDENT_AMBULATORY_CARE_PROVIDER_SITE_OTHER): Payer: 59 | Admitting: Sports Medicine

## 2016-12-21 VITALS — BP 118/70 | Ht 68.5 in | Wt 175.0 lb

## 2016-12-21 DIAGNOSIS — M25562 Pain in left knee: Secondary | ICD-10-CM

## 2016-12-21 DIAGNOSIS — M25552 Pain in left hip: Secondary | ICD-10-CM

## 2016-12-21 DIAGNOSIS — M533 Sacrococcygeal disorders, not elsewhere classified: Secondary | ICD-10-CM

## 2016-12-21 NOTE — Assessment & Plan Note (Signed)
Showing swelling around ITB  Ice massage Prn aleve  Stretches and exercises for home  Reck if not improved in 6 ws

## 2016-12-21 NOTE — Progress Notes (Signed)
   Subjective:    ABBEGALE STEHLE - 51 y.o. female MRN [id number]  Date of birth: 1966/02/07  CC: Left knee pain  HPI:  MISHELLE HASSAN is a 51 y/o female with PMHx of scoliosis presenting with left knee pain. Few weeks ago she was at a beach house that required extensive use of stairs. Her knee pain started at that time and it has worsened in the interim. Pain is located in the SI joint as well and radiates along the lateral aspect of her thigh down to her knee joint. Pain is worse with prolonged sitting and while lying down. Advil and the use of custom orthotics help alleviate the pain temporarily. She states that her pain has worsened over the past weeks.  ROS: Denies fevers, chills, or weight changes. No locking or popping of the knee. No swelling.  PMH: Scoliosis, left leg inequality, pigmented villonodular synovitis of right knee SH: Registered nurse    Objective:   Physical Exam BP 118/70   Ht 5' 8.5" (1.74 m)   Wt 175 lb (79.4 kg)   BMI 26.22 kg/m  Gen: NAD, alert, cooperative with exam, well-appearing Skin: no rashes, normal turgor   Left Knee: No erythema, effusion or obvious bony deformities TTP over the posterolateral aspect knee; no joint line or patellar tenderness FROM upon flexion and extension;  ACL, PCL, LCL, and MCL intact Negative Mcmurray's test  Left hip: SI joint tender to palpation; Normal range of motion RT but tight on left Muscle strength testing is normal Positive FABER test  Mild Trendelenburg to the left  Left Knee Ultrasound: No effusion noted in the joint.  MCL and LCL were intact.  Medial meniscus normal. Lateral meniscus normal. Hypoechoic change under distal ITB Hypoechoic change surrounds popliteus tendon Patellar and quadriceps tendon normal.  Impression ITB strain with distal swelling  Ultrasound and interpretation by Wolfgang Phoenix. Thurlow Gallaga, MD     Assessment & Plan:  SYDNIE SIGMUND is a 51 y/o female with PMHx of scoliosis presenting with  left knee pain.  SI joint tightness 2/2 leg length inequality She had a positive FABER test and her gait has worsened slightly which is most likely contributing to left sided tightness of SI joint. It could also be tightening her IT band causing further pain while ambulating. She has custom orthotics that she uses with tennis shoes. Will try stretching exercises to improve her strength. Will consider addition of heel lift or new set of orthotics if no improvement in symptoms. -Stretching exercises -New orthotics if necessary

## 2016-12-21 NOTE — Assessment & Plan Note (Signed)
With some syptoms triggered by walking steps  I think she needs to start using her lift on left for routine walking  Hip ROM exercises  stretches

## 2017-01-25 MED FILL — GABAPENTIN 300 MG CAPSULE: 300 | 90 days supply | Qty: 180 | Fill #0

## 2017-01-30 ENCOUNTER — Ambulatory Visit (INDEPENDENT_AMBULATORY_CARE_PROVIDER_SITE_OTHER): Payer: 59 | Admitting: Sports Medicine

## 2017-01-30 ENCOUNTER — Ambulatory Visit (HOSPITAL_COMMUNITY)
Admission: RE | Admit: 2017-01-30 | Discharge: 2017-01-30 | Disposition: A | Discharge status: Home / Self Care | Payer: 59 | Source: Ambulatory Visit | Attending: Sports Medicine | Admitting: Sports Medicine

## 2017-01-30 VITALS — BP 124/82 | Ht 68.5 in | Wt 180.0 lb

## 2017-01-30 DIAGNOSIS — M25562 Pain in left knee: Secondary | ICD-10-CM | POA: Insufficient documentation

## 2017-01-30 DIAGNOSIS — M1712 Unilateral primary osteoarthritis, left knee: Secondary | ICD-10-CM | POA: Diagnosis not present

## 2017-01-30 DIAGNOSIS — M533 Sacrococcygeal disorders, not elsewhere classified: Secondary | ICD-10-CM | POA: Diagnosis not present

## 2017-01-30 NOTE — Assessment & Plan Note (Signed)
Continue her exercises she has shown some improvement in the symptoms that referred to the SI joint Okay to use Advil or Aleve She may want to increase her gabapentin some because of the radicular symptoms

## 2017-01-30 NOTE — Addendum Note (Signed)
Addended by: Cyd Silence on: 01/30/2017 05:39 PM   Modules accepted: Orders

## 2017-01-30 NOTE — Assessment & Plan Note (Signed)
Ultrasound today shows an effusion which is a new finding  She seems to have some injury to the posterior lateral corner but there are no diagnostic findings on ultrasound  Plan to check standing knee films  We may need an MRI if we do not have significant changes on plain films

## 2017-01-30 NOTE — Progress Notes (Addendum)
Left knee pain  started late May Lots steps beach house Evaluation oh June 7 showed more problems around the left SI joint Exercises for this have helped her pain Knee evaluation of that visit was not remarkable  However over the past 2 weeks the knee has been more problematic Sometimes she feels that it'sunstable if she wants to pivot Pain in the left knee is more posterior and more to the outer portion The knee now feels tight For a couple of days it was painful to put her weight on her leg  Review of systems Some pain does radiate down the lateral and posterior leg No cough or sneeze pain No pain in the low back except over the left SI joint  Physical exam Pleasant female in no acute distress BP 124/82   Ht 5' 8.5" (1.74 m)   Wt 180 lb (81.6 kg)   BMI 26.97 kg/m   Knee: Normal to inspection with no erythema or effusion or obvious bony abnormalities. Palpation normal with no warmth or joint line tenderness or patellar tenderness or condyle tenderness. ROM normal in flexion and extension and lower leg rotation. Ligaments with solid consistent endpoints including ACL, PCL, LCL, MCL. Negative Mcmurray's and provocative meniscal tests. Non painful patellar compression. Patellar and quadriceps tendons unremarkable. Hamstring and quadriceps strength is normal.  Mild but less tenderness over the left SI joint Improvement in FABER Normal range of motion of both hips with no pain  Straight leg raise negative Neurological assessment negative  Repeat ultrasound scanning Compared to prior visit she now has an effusion in the suprapatellar pouch Both the lateral and the medial meniscuslooked normal There is some swelling around the upper Medial gastroc tendon Associated with a small Baker's cyst Popliteus tendon shows some swelling as well Patellar and quadriceps tendons are normal  Summary- there is an effusion and some probable changes in the posterior lateral corner; small  Baker's cyst.  Ultrasound and interpretation by Wolfgang Phoenix. Cowen Pesqueira, MD  XR of Knee There is mild joint space narrowing in medial compartment.  Fabella noted. I don't see changes to explain her pain pattern and effusion.  Need to look at MRI to see if there is OCD or other intrarticular process.

## 2017-02-01 ENCOUNTER — Ambulatory Visit (HOSPITAL_COMMUNITY)
Admission: RE | Admit: 2017-02-01 | Discharge: 2017-02-01 | Disposition: A | Discharge status: Home / Self Care | Payer: 59 | Source: Ambulatory Visit | Attending: Sports Medicine | Admitting: Sports Medicine

## 2017-02-01 DIAGNOSIS — M25562 Pain in left knee: Secondary | ICD-10-CM | POA: Diagnosis not present

## 2017-02-01 DIAGNOSIS — M1712 Unilateral primary osteoarthritis, left knee: Secondary | ICD-10-CM | POA: Diagnosis not present

## 2017-02-05 ENCOUNTER — Other Ambulatory Visit: Payer: Self-pay | Admitting: Sports Medicine

## 2017-02-05 DIAGNOSIS — M25562 Pain in left knee: Secondary | ICD-10-CM

## 2017-02-28 ENCOUNTER — Other Ambulatory Visit: Payer: Self-pay

## 2017-02-28 DIAGNOSIS — M25562 Pain in left knee: Secondary | ICD-10-CM

## 2017-03-05 DIAGNOSIS — M25562 Pain in left knee: Secondary | ICD-10-CM | POA: Diagnosis not present

## 2017-03-08 ENCOUNTER — Other Ambulatory Visit: Payer: Self-pay | Admitting: Orthopaedic Surgery

## 2017-03-08 ENCOUNTER — Other Ambulatory Visit: Payer: Self-pay | Admitting: Family Medicine

## 2017-03-08 DIAGNOSIS — Z1231 Encounter for screening mammogram for malignant neoplasm of breast: Secondary | ICD-10-CM

## 2017-03-13 NOTE — Pre-Procedure Instructions (Signed)
Crystal Ortiz  03/13/2017      [place], [place] - [address] [facility] [address] [place] [address] Phone: [phone] Fax: [phone]  [facility] [place], [place] - [place] AT [facility] [address] [place] [address] [place] [address] Phone: [phone] Fax: [phone]  [place], [place] [address] Phone: [phone] Fax: [phone]    Your procedure is scheduled on September 4  Report to [place] at 1100 A.M.  Call this number if you have problems the morning of surgery:  [phone]   Remember:  Do not eat food or drink liquids after midnight.  Continue all other medications as directed by your physician except follow these instructions about you medications   Take these medicines the morning of surgery with A SIP OF WATER NONE 7 days prior to surgery STOP taking any Aspirin, Aleve, Naproxen, Ibuprofen, Motrin, Advil, Goody's, BC's, all herbal medications, fish oil, and all vitamins    Do not wear jewelry, make-up or nail polish.  Do not wear lotions, powders, or perfumes, or deoderant.  Do not shave 48 hours prior to surgery.  Men may shave face and neck.  Do not bring valuables to the hospital.  [facility] is not responsible for any belongings or valuables.  Contacts, dentures or bridgework may not be worn into surgery.  Leave your suitcase in the car.  After surgery it may be brought to your room.  For patients admitted to the hospital, discharge time will be determined by your treatment team.  Patients discharged the day of surgery will not be allowed to drive home.    Special instructions:   [place]- Preparing For Surgery  Before surgery, you can play an important role. Because skin is not sterile, your skin needs to be as free of germs as possible.  You can reduce the number of germs on your skin by washing with CHG (chlorahexidine gluconate) Soap before surgery.  CHG is an antiseptic cleaner which kills germs and bonds with the skin to continue killing germs even after washing.  Please do not use if you have an allergy to CHG or antibacterial soaps. If your skin becomes reddened/irritated stop using the CHG.  Do not shave (including legs and underarms) for at least 48 hours prior to first CHG shower. It is OK to shave your face.  Please follow these instructions carefully.   1. Shower the NIGHT BEFORE SURGERY and the MORNING OF SURGERY with CHG.   2. If you chose to wash your hair, wash your hair first as usual with your normal shampoo.  3. After you shampoo, rinse your hair and body thoroughly to remove the shampoo.  4. Use CHG as you would any other liquid soap. You can apply CHG directly to the skin and wash gently with a scrungie or a clean washcloth.   5. Apply the CHG Soap to your body ONLY FROM THE NECK DOWN.  Do not use on open wounds or open sores. Avoid contact with your eyes, ears, mouth and genitals (private parts). Wash genitals (private parts) with your normal soap.  6. Wash thoroughly, paying special attention to the area where your surgery will be performed.  7. Thoroughly rinse your body with warm water from the neck down.  8. DO NOT shower/wash with your normal soap after  using and rinsing off the CHG Soap.  9. Pat yourself dry with a CLEAN TOWEL.   10. Wear CLEAN PAJAMAS   11. Place CLEAN SHEETS on your bed the night of your first shower and DO NOT SLEEP WITH PETS.    Day of Surgery: Do not apply any deodorants/lotions. Please wear clean clothes to the hospital/surgery center.      Please read over the following fact sheets that you were given.

## 2017-03-14 ENCOUNTER — Encounter (HOSPITAL_COMMUNITY)
Admission: RE | Admit: 2017-03-14 | Discharge: 2017-03-14 | Disposition: A | Discharge status: Home / Self Care | Payer: 59 | Source: Ambulatory Visit | Attending: Orthopaedic Surgery | Admitting: Orthopaedic Surgery

## 2017-03-14 ENCOUNTER — Encounter (HOSPITAL_COMMUNITY): Payer: Self-pay

## 2017-03-14 DIAGNOSIS — Z01818 Encounter for other preprocedural examination: Secondary | ICD-10-CM | POA: Insufficient documentation

## 2017-03-14 HISTORY — DX: Cardiac arrhythmia, unspecified: I49.9

## 2017-03-14 HISTORY — DX: Adverse effect of unspecified anesthetic, initial encounter: T41.45XA

## 2017-03-14 LAB — CBC
HCT: 39.5 % (ref 36.0–46.0)
Hemoglobin: 12.8 g/dL (ref 12.0–15.0)
MCH: 29.2 pg (ref 26.0–34.0)
MCHC: 32.4 g/dL (ref 30.0–36.0)
MCV: 90.2 fL (ref 78.0–100.0)
Platelets: 194 10*3/uL (ref 150–400)
RBC: 4.38 MIL/uL (ref 3.87–5.11)
RDW: 13.2 % (ref 11.5–15.5)
WBC: 5.6 10*3/uL (ref 4.0–10.5)

## 2017-03-14 LAB — HCG, SERUM, QUALITATIVE: PREG SERUM: NEGATIVE

## 2017-03-14 NOTE — Pre-Procedure Instructions (Addendum)
Crystal Ortiz  03/14/2017      [place], [place] - [address] [facility]. [address] [place] [address] Phone: [phone] Fax: [phone]  [facility] [place], [place] - [place] AT [facility] OF [place] [address] Phone: [phone] Fax: [phone]  [place], [place] - [place] [address] Phone: [phone] Fax: [phone]    Your procedure is scheduled on September 4  Report to [place] at 1100 A.M.  Call this number if you have problems the morning of surgery:  [phone]   Remember:  Do not eat food or drink liquids after midnight.  Continue all other medications as directed by your physician except follow these instructions about you medications   Take these medicines the morning of surgery with A SIP OF WATER     NONE   prior to surgery starting today 03/14/17 STOP taking any Aspirin, Aleve, Naproxen, Ibuprofen, Motrin, Advil, Goody's, BC's, all herbal medications, fish oil, and all vitamins    Do not wear jewelry, make-up or nail polish.  Do not wear lotions, powders, or perfumes, or deoderant.  Do not shave 48 hours prior to surgery.  Men may shave face and neck.  Do not bring valuables to the hospital.  [facility] is not responsible for any belongings or valuables.  Contacts, dentures or bridgework may not be worn into surgery.  Leave your suitcase in the car.  After surgery it may be brought to your room.  For patients admitted to the hospital, discharge time will be determined by your treatment team.  Patients discharged the day of surgery will not be allowed to drive home.    Special instructions:   [place]- Preparing For Surgery  Before surgery, you can play an important role. Because skin is not sterile, your skin needs to be as free  of germs as possible. You can reduce the number of germs on your skin by washing with CHG (chlorahexidine gluconate) Soap before surgery.  CHG is an antiseptic cleaner which kills germs and bonds with the skin to continue killing germs even after washing.  Please do not use if you have an allergy to CHG or antibacterial soaps. If your skin becomes reddened/irritated stop using the CHG.  Do not shave (including legs and underarms) for at least 48 hours prior to first CHG shower. It is OK to shave your face.  Please follow these instructions carefully.   1. Shower the NIGHT BEFORE SURGERY and the MORNING OF SURGERY with CHG.   2. If you chose to wash your hair, wash your hair first as usual with your normal shampoo.  3. After you shampoo, rinse your hair and body thoroughly to remove the shampoo.  4. Use CHG as you would any other liquid soap. You can apply CHG directly to the skin and wash gently with a scrungie or a clean washcloth.   5. Apply the CHG Soap to your body ONLY FROM THE NECK DOWN.  Do not use on open wounds or open sores. Avoid contact with your eyes, ears, mouth and genitals (private parts). Wash genitals (private parts) with your normal soap.  6. Wash thoroughly, paying special attention to the area where your surgery will be performed.  7. Thoroughly rinse your body with warm water from the neck down.  8. DO  NOT shower/wash with your normal soap after using and rinsing off the CHG Soap.  9. Pat yourself dry with a CLEAN TOWEL.   10. Wear CLEAN PAJAMAS   11. Place CLEAN SHEETS on your bed the night of your first shower and DO NOT SLEEP WITH PETS.    Day of Surgery: Do not apply any deodorants/lotions. Please wear clean clothes to the hospital/surgery center.      Please read over the following fact sheets that you were given.

## 2017-03-16 NOTE — H&P (Signed)
Crystal Ortiz is an 51 y.o. female.   Chief Complaint: left knee pain HPI: Camelia is here for the first time in more than a year.  Her left knee has been much worse over the last 3 months.  This led to a repeat evaluation through Dr. Oneida Alar.  She has had x-rays done, ultrasound, and an MRI scan.  She persists with pain on the inside aspect of her knee.  This makes it difficult for her to exercise at all.  She says her husband has to do the grocery shopping at this point.  She has trouble resting at night.  The pain is intermittent and severe.  She says it is not getting better.   MRI:  I reviewed an MRI scan films and report of a study done at [facility] on 02/02/17.  This shows an impressive medial meniscus tear with really no degenerative change even patellofemoral.  I also reviewed some plain films on the [facility] system dated 12/21/16 which look normal.  Past Medical History:  Diagnosis Date  . Complication of anesthesia 1995   nasal intubation- told was dificult intubation  . Dysrhythmia 2009   palpitations  . Seasonal allergies     Past Surgical History:  Procedure Laterality Date  . exicision right knee synovium  1987   HP; for pigmented villonodular synovitis   . MANDIBLE FRACTURE SURGERY  1995   nasal intubation -told difficult intubation    No family history on file. Social History:  reports that she has never smoked. She has never used smokeless tobacco. She reports that she drinks alcohol. She reports that she does not use drugs.  Allergies:  Allergies  Allergen Reactions  . Sulfonamide Derivatives     REACTION: Rash    No prescriptions prior to admission.    No results found for this or any previous visit (from the past 48 hour(s)). No results found.  Review of Systems  Musculoskeletal: Positive for joint pain.       Left knee  All other systems reviewed and are negative.   There were no vitals taken for this visit. Physical Exam  Constitutional: She is oriented to person,  place, and time. She appears well-developed and well-nourished.  HENT:  Head: Normocephalic and atraumatic.  Eyes: Pupils are equal, round, and reactive to light.  Neck: Normal range of motion.  Cardiovascular: Normal rate and regular rhythm.   Respiratory: Effort normal.  GI: Soft.  Musculoskeletal:  Left knee has trace effusion.  Her motion is 0-120 at which point she has some terrible pain.  She has intense medial joint line pain to palpation.  McMurray's test in the medial direction is very painful.  Ligaments feel stable.  I do not feel any crepitation.   Hip motion is full and pain free and SLR is negative on both sides.  There is no palpable LAD behind either knee.  Sensation and motor function are intact on both sides and there are palpable pulses on both sides.  Neurological: She is alert and oriented to person, place, and time.  Skin: Skin is warm and dry.  Psychiatric: She has a normal mood and affect. Her behavior is normal. Judgment and thought content normal.     Assessment/Plan Assessment: Left knee torn medial meniscus by MRI 2018  Plan: Henli has been symptomatic for several months.  She has a demonstrable meniscus tear on MRI.  I do not think anything short of a knee arthroscopy is going to make her much  better. I reviewed risks of anesthesia and infection as well as potential for DVT related to a knee arthroscopy.  I've stressed the importance of some postoperative physical therapy to optimize results and we will try to set up an appointment.  Two to four  weeks for recovery would be typical but that is a little variable.   Josha Weekley, Larwance Sachs, PA-C 03/16/2017, 2:13 PM

## 2017-03-18 MED ORDER — CEFAZOLIN SODIUM-DEXTROSE 2-4 GM/100ML-% IV SOLN
2.0000 g | INTRAVENOUS | Status: AC
  Administered 2017-03-20: 2 g via INTRAVENOUS
  Filled 2017-03-18: qty 100

## 2017-03-20 ENCOUNTER — Ambulatory Visit (HOSPITAL_COMMUNITY): Payer: 59 | Admitting: Certified Registered Nurse Anesthetist

## 2017-03-20 ENCOUNTER — Ambulatory Visit (HOSPITAL_COMMUNITY)
Admission: RE | Admit: 2017-03-20 | Discharge: 2017-03-20 | Disposition: A | Discharge status: Home / Self Care | Payer: 59 | Source: Ambulatory Visit | Attending: Orthopaedic Surgery | Admitting: Orthopaedic Surgery

## 2017-03-20 ENCOUNTER — Encounter (HOSPITAL_COMMUNITY)
Admission: RE | Disposition: A | Discharge status: Home / Self Care | Payer: Self-pay | Source: Ambulatory Visit | Attending: Orthopaedic Surgery

## 2017-03-20 ENCOUNTER — Ambulatory Visit (HOSPITAL_COMMUNITY): Payer: 59 | Admitting: Emergency Medicine

## 2017-03-20 DIAGNOSIS — R269 Unspecified abnormalities of gait and mobility: Secondary | ICD-10-CM | POA: Diagnosis not present

## 2017-03-20 DIAGNOSIS — S83242A Other tear of medial meniscus, current injury, left knee, initial encounter: Secondary | ICD-10-CM | POA: Diagnosis not present

## 2017-03-20 DIAGNOSIS — M94262 Chondromalacia, left knee: Secondary | ICD-10-CM | POA: Diagnosis not present

## 2017-03-20 DIAGNOSIS — M23322 Other meniscus derangements, posterior horn of medial meniscus, left knee: Secondary | ICD-10-CM | POA: Diagnosis not present

## 2017-03-20 DIAGNOSIS — Z882 Allergy status to sulfonamides status: Secondary | ICD-10-CM | POA: Diagnosis not present

## 2017-03-20 DIAGNOSIS — M898X6 Other specified disorders of bone, lower leg: Secondary | ICD-10-CM | POA: Diagnosis not present

## 2017-03-20 DIAGNOSIS — R002 Palpitations: Secondary | ICD-10-CM | POA: Insufficient documentation

## 2017-03-20 DIAGNOSIS — M533 Sacrococcygeal disorders, not elsewhere classified: Secondary | ICD-10-CM | POA: Diagnosis not present

## 2017-03-20 DIAGNOSIS — M23204 Derangement of unspecified medial meniscus due to old tear or injury, left knee: Secondary | ICD-10-CM | POA: Diagnosis present

## 2017-03-20 DIAGNOSIS — M23222 Derangement of posterior horn of medial meniscus due to old tear or injury, left knee: Secondary | ICD-10-CM | POA: Diagnosis not present

## 2017-03-20 HISTORY — PX: KNEE ARTHROSCOPY: SHX127

## 2017-03-20 SURGERY — ARTHROSCOPY, KNEE
Anesthesia: General | Site: Knee | Laterality: Left

## 2017-03-20 MED ORDER — MIDAZOLAM HCL 2 MG/2ML IJ SOLN
INTRAMUSCULAR | Status: AC
  Filled 2017-03-20: qty 2

## 2017-03-20 MED ORDER — PROPOFOL 10 MG/ML IV BOLUS
INTRAVENOUS | Status: AC
  Filled 2017-03-20: qty 20

## 2017-03-20 MED ORDER — ONDANSETRON HCL 4 MG/2ML IJ SOLN
INTRAMUSCULAR | Status: AC
  Filled 2017-03-20: qty 2

## 2017-03-20 MED ORDER — HYDROCODONE-ACETAMINOPHEN 5-325 MG PO TABS: 1.0000 | ORAL_TABLET | ORAL | 0 refills | Status: DC | PRN

## 2017-03-20 MED ORDER — BUPIVACAINE-EPINEPHRINE (PF) 0.5% -1:200000 IJ SOLN
INTRAMUSCULAR | Status: AC
  Filled 2017-03-20: qty 30

## 2017-03-20 MED ORDER — PROPOFOL 10 MG/ML IV BOLUS
INTRAVENOUS | Status: DC | PRN
  Administered 2017-03-20: 200 mg via INTRAVENOUS

## 2017-03-20 MED ORDER — OXYCODONE HCL 5 MG/5ML PO SOLN: 5.0000 mg | Freq: Once | ORAL | Status: AC | PRN

## 2017-03-20 MED ORDER — HYDROMORPHONE HCL 1 MG/ML IJ SOLN
0.2500 mg | INTRAMUSCULAR | Status: DC | PRN
Start: 2017-03-20 — End: 2017-03-20
  Administered 2017-03-20 (×2): 0.5 mg via INTRAVENOUS

## 2017-03-20 MED ORDER — LABETALOL HCL 5 MG/ML IV SOLN
INTRAVENOUS | Status: DC | PRN
  Administered 2017-03-20: 2.5 mg via INTRAVENOUS

## 2017-03-20 MED ORDER — BUPIVACAINE-EPINEPHRINE 0.5% -1:200000 IJ SOLN
INTRAMUSCULAR | Status: DC | PRN
  Administered 2017-03-20: 15 mL

## 2017-03-20 MED ORDER — LIDOCAINE HCL (CARDIAC) 20 MG/ML IV SOLN
INTRAVENOUS | Status: DC | PRN
  Administered 2017-03-20: 60 mg via INTRATRACHEAL

## 2017-03-20 MED ORDER — OXYCODONE HCL 5 MG PO TABS
5.0000 mg | ORAL_TABLET | Freq: Once | ORAL | Status: AC | PRN
  Administered 2017-03-20: 5 mg via ORAL

## 2017-03-20 MED ORDER — SODIUM CHLORIDE 0.9 % IR SOLN
Status: DC | PRN
  Administered 2017-03-20: 6000 mL

## 2017-03-20 MED ORDER — DEXAMETHASONE SODIUM PHOSPHATE 10 MG/ML IJ SOLN
INTRAMUSCULAR | Status: DC | PRN
  Administered 2017-03-20: 5 mg via INTRAVENOUS

## 2017-03-20 MED ORDER — FENTANYL CITRATE (PF) 250 MCG/5ML IJ SOLN
INTRAMUSCULAR | Status: DC | PRN
  Administered 2017-03-20 (×3): 50 ug via INTRAVENOUS

## 2017-03-20 MED ORDER — PROMETHAZINE HCL 25 MG/ML IJ SOLN
6.2500 mg | INTRAMUSCULAR | Status: DC | PRN
  Administered 2017-03-20: 6.25 mg via INTRAVENOUS

## 2017-03-20 MED ORDER — MIDAZOLAM HCL 2 MG/2ML IJ SOLN
INTRAMUSCULAR | Status: DC | PRN
  Administered 2017-03-20: 2 mg via INTRAVENOUS

## 2017-03-20 MED ORDER — FENTANYL CITRATE (PF) 250 MCG/5ML IJ SOLN
INTRAMUSCULAR | Status: AC
  Filled 2017-03-20: qty 5

## 2017-03-20 MED ORDER — OXYCODONE HCL 5 MG PO TABS
ORAL_TABLET | ORAL | Status: AC
  Filled 2017-03-20: qty 1

## 2017-03-20 MED ORDER — PROMETHAZINE HCL 25 MG/ML IJ SOLN
INTRAMUSCULAR | Status: AC
  Filled 2017-03-20: qty 1

## 2017-03-20 MED ORDER — HYDROMORPHONE HCL 1 MG/ML IJ SOLN
INTRAMUSCULAR | Status: AC
  Filled 2017-03-20: qty 1

## 2017-03-20 MED ORDER — LACTATED RINGERS IV SOLN
INTRAVENOUS | Status: DC
  Administered 2017-03-20: 12:00:00 via INTRAVENOUS

## 2017-03-20 MED ORDER — DEXAMETHASONE SODIUM PHOSPHATE 10 MG/ML IJ SOLN
INTRAMUSCULAR | Status: AC
  Filled 2017-03-20: qty 1

## 2017-03-20 MED ORDER — CHLORHEXIDINE GLUCONATE 4 % EX LIQD: 60.0000 mL | Freq: Once | CUTANEOUS | Status: DC

## 2017-03-20 MED ORDER — ONDANSETRON HCL 4 MG/2ML IJ SOLN
INTRAMUSCULAR | Status: DC | PRN
  Administered 2017-03-20: 4 mg via INTRAVENOUS

## 2017-03-20 MED FILL — HYDROCODON-APAP 5-325: 5-325 | 3 days supply | Qty: 30 | Fill #0

## 2017-03-20 SURGICAL SUPPLY — 38 items
BANDAGE ACE 6X5 VEL STRL LF (GAUZE/BANDAGES/DRESSINGS) ×3 IMPLANT
BLADE GREAT WHITE 4.2 (BLADE) ×2 IMPLANT
BLADE GREAT WHITE 4.2MM (BLADE) ×1
BLADE SURG 11 STRL SS (BLADE) IMPLANT
BNDG GAUZE ELAST 4 BULKY (GAUZE/BANDAGES/DRESSINGS) ×3 IMPLANT
COVER SURGICAL LIGHT HANDLE (MISCELLANEOUS) ×3 IMPLANT
CUFF TOURNIQUET SINGLE 34IN LL (TOURNIQUET CUFF) IMPLANT
CUFF TOURNIQUET SINGLE 44IN (TOURNIQUET CUFF) IMPLANT
DRAPE ARTHROSCOPY W/POUCH 114 (DRAPES) ×3 IMPLANT
DRAPE U-SHAPE 47X51 STRL (DRAPES) ×3 IMPLANT
DRSG EMULSION OIL 3X3 NADH (GAUZE/BANDAGES/DRESSINGS) ×3 IMPLANT
DRSG PAD ABDOMINAL 8X10 ST (GAUZE/BANDAGES/DRESSINGS) ×3 IMPLANT
DURAPREP 26ML APPLICATOR (WOUND CARE) ×3 IMPLANT
GAUZE SPONGE 4X4 12PLY STRL (GAUZE/BANDAGES/DRESSINGS) ×3 IMPLANT
GAUZE SPONGE 4X4 12PLY STRL LF (GAUZE/BANDAGES/DRESSINGS) ×2 IMPLANT
GLOVE BIO SURGEON STRL SZ8 (GLOVE) ×12 IMPLANT
GLOVE BIOGEL PI IND STRL 8 (GLOVE) ×1 IMPLANT
GLOVE BIOGEL PI INDICATOR 8 (GLOVE) ×2
GOWN STRL REUS W/ TWL LRG LVL3 (GOWN DISPOSABLE) ×3 IMPLANT
GOWN STRL REUS W/ TWL XL LVL3 (GOWN DISPOSABLE) ×3 IMPLANT
GOWN STRL REUS W/TWL 2XL LVL3 (GOWN DISPOSABLE) ×3 IMPLANT
GOWN STRL REUS W/TWL LRG LVL3 (GOWN DISPOSABLE) ×9
GOWN STRL REUS W/TWL XL LVL3 (GOWN DISPOSABLE) ×9
KIT ROOM TURNOVER OR (KITS) ×3 IMPLANT
MANIFOLD NEPTUNE II (INSTRUMENTS) ×3 IMPLANT
NDL 18GX1X1/2 (RX/OR ONLY) (NEEDLE) ×1 IMPLANT
NDL SPNL 18GX3.5 QUINCKE PK (NEEDLE) IMPLANT
NEEDLE 18GX1X1/2 (RX/OR ONLY) (NEEDLE) ×3 IMPLANT
NEEDLE 22X1 1/2 (OR ONLY) (NEEDLE) IMPLANT
NEEDLE SPNL 18GX3.5 QUINCKE PK (NEEDLE) IMPLANT
PACK ARTHROSCOPY DSU (CUSTOM PROCEDURE TRAY) ×3 IMPLANT
PAD ABD 8X10 STRL (GAUZE/BANDAGES/DRESSINGS) ×2 IMPLANT
PAD ARMBOARD 7.5X6 YLW CONV (MISCELLANEOUS) ×6 IMPLANT
SET ARTHROSCOPY TUBING (MISCELLANEOUS) ×3
SET ARTHROSCOPY TUBING LN (MISCELLANEOUS) ×1 IMPLANT
SYR CONTROL 10ML LL (SYRINGE) ×3 IMPLANT
TOWEL OR 17X24 6PK STRL BLUE (TOWEL DISPOSABLE) ×6 IMPLANT
WATER STERILE IRR 1000ML POUR (IV SOLUTION) ×3 IMPLANT

## 2017-03-20 NOTE — Op Note (Signed)
#  [id number] 

## 2017-03-20 NOTE — Anesthesia Preprocedure Evaluation (Addendum)
Anesthesia Evaluation  Patient identified by MRN, date of birth, ID band Patient awake    Reviewed: Allergy & Precautions, NPO status , Patient's Chart, lab work & pertinent test results  History of Anesthesia Complications (+) DIFFICULT AIRWAY and history of anesthetic complications  Airway Mallampati: I  TM Distance: >3 FB Neck ROM: Full    Dental no notable dental hx.    Pulmonary neg pulmonary ROS,    Pulmonary exam normal breath sounds clear to auscultation       Cardiovascular negative cardio ROS Normal cardiovascular exam Rhythm:Regular Rate:Normal  ECG: NSR, rate 68   Neuro/Psych negative neurological ROS  negative psych ROS   GI/Hepatic negative GI ROS, Neg liver ROS,   Endo/Other  negative endocrine ROS  Renal/GU negative Renal ROS     Musculoskeletal negative musculoskeletal ROS (+)   Abdominal   Peds  Hematology negative hematology ROS (+)   Anesthesia Other Findings   Reproductive/Obstetrics hcg negative                            Anesthesia Physical Anesthesia Plan  ASA: II  Anesthesia Plan: General   Post-op Pain Management:    Induction: Intravenous  PONV Risk Score and Plan: 3 and Ondansetron, Dexamethasone and Midazolam  Airway Management Planned: LMA  Additional Equipment:   Intra-op Plan:   Post-operative Plan: Extubation in OR  Informed Consent: I have reviewed the patients History and Physical, chart, labs and discussed the procedure including the risks, benefits and alternatives for the proposed anesthesia with the patient or authorized representative who has indicated his/her understanding and acceptance.   Dental advisory given  Plan Discussed with: CRNA  Anesthesia Plan Comments:         Anesthesia Quick Evaluation

## 2017-03-20 NOTE — Anesthesia Procedure Notes (Signed)
Procedure Name: LMA Insertion Date/Time: 03/20/2017 12:50 PM Performed by: Julieta Bellini Pre-anesthesia Checklist: Patient identified, Emergency Drugs available, Suction available and Patient being monitored Patient Re-evaluated:Patient Re-evaluated prior to induction Oxygen Delivery Method: Circle system utilized Preoxygenation: Pre-oxygenation with 100% oxygen Induction Type: IV induction Ventilation: Mask ventilation without difficulty LMA: LMA inserted LMA Size: 4.0 Tube type: Oral Number of attempts: 1 Placement Confirmation: positive ETCO2 and breath sounds checked- equal and bilateral Tube secured with: Tape Dental Injury: Teeth and Oropharynx as per pre-operative assessment

## 2017-03-20 NOTE — Interval H&P Note (Signed)
History and Physical Interval Note:  03/20/2017 11:27 AM  Crystal Ortiz  has presented today for surgery, with the diagnosis of LEFT KNEE MEDIAL MENISCAL TEAR AND CHONDROMALACIA  The various methods of treatment have been discussed with the patient and family. After consideration of risks, benefits and other options for treatment, the patient has consented to  Procedure(s): ARTHROSCOPY KNEE (Left) as a surgical intervention .  The patient's history has been reviewed, patient examined, no change in status, stable for surgery.  I have reviewed the patient's chart and labs.  Questions were answered to the patient's satisfaction.     Rocky Rishel G

## 2017-03-20 NOTE — Anesthesia Postprocedure Evaluation (Signed)
Anesthesia Post Note  Patient: Crystal Ortiz  Procedure(s) Performed: Procedure(s) (LRB): ARTHROSCOPY KNEE (Left)     Patient location during evaluation: PACU Anesthesia Type: General Level of consciousness: awake and alert Pain management: pain level controlled Vital Signs Assessment: post-procedure vital signs reviewed and stable Respiratory status: spontaneous breathing, nonlabored ventilation, respiratory function stable and patient connected to nasal cannula oxygen Cardiovascular status: blood pressure returned to baseline and stable Postop Assessment: no signs of nausea or vomiting Anesthetic complications: no    Last Vitals:  Vitals:   03/20/17 1424 03/20/17 1430  BP: 139/71 139/71  Pulse: 78 91  Resp: 19 13  Temp:  (!) 36.1 C  SpO2: 100% 96%    Last Pain:  Vitals:   03/20/17 1400  PainSc: 5                  Tyeesha Riker P Shifra Swartzentruber

## 2017-03-20 NOTE — Transfer of Care (Signed)
Immediate Anesthesia Transfer of Care Note  Patient: Crystal Ortiz  Procedure(s) Performed: Procedure(s): ARTHROSCOPY KNEE (Left)  Patient Location: PACU  Anesthesia Type:General  Level of Consciousness: awake, alert , oriented and patient cooperative  Airway & Oxygen Therapy: Patient Spontanous Breathing and Patient connected to nasal cannula oxygen  Post-op Assessment: Report given to RN, Post -op Vital signs reviewed and stable and Patient moving all extremities X 4  Post vital signs: Reviewed and stable  Last Vitals:  Vitals:   03/20/17 1117 03/20/17 1322  BP: 137/68 (P) 129/72  Pulse: 81 (P) 80  Resp: 18 (P) 12  Temp: 36.7 C (P) 36.5 C  SpO2: 100% (P) 100%    Last Pain:  Vitals:   03/20/17 1200  PainSc: 0-No pain         Complications: No apparent anesthesia complications

## 2017-03-21 ENCOUNTER — Encounter (HOSPITAL_COMMUNITY): Payer: Self-pay | Admitting: Orthopaedic Surgery

## 2017-03-21 NOTE — Op Note (Signed)
NAME:  Bureau, Ardie                       ACCOUNT NO.:  MEDICAL RECORD NO.:  [id number]  LOCATION:                                 FACILITY:  PHYSICIAN:  Monico Blitz. Rhona Raider, M.D.     DATE OF BIRTH:  DATE OF PROCEDURE:  03/20/2017 DATE OF DISCHARGE:                              OPERATIVE REPORT   PREOPERATIVE DIAGNOSES: 1. Left knee torn medial meniscus. 2. Left knee chondromalacia.  POSTOPERATIVE DIAGNOSES: 1. Left knee torn medial meniscus. 2. Left knee chondromalacia.  PROCEDURES: 1. Left knee partial meniscectomy. 2. Left knee abrasion chondroplasty.  ANESTHESIA:  General.  ATTENDING SURGEON:  Monico Blitz. Rhona Raider, M.D.  ASSISTANT:  Loni Dolly, PA.  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  She has persisted with mechanical symptoms and extreme pain despite conservative measures of pills, bracing, injection, and exercise.  By MRI scan, she has a medial meniscus tear. She is offered an arthroscopy.  Informed operative consent was obtained after discussion of possible complications including reaction to anesthesia and infection.  SUMMARY OF FINDINGS AND PROCEDURE:  Under general anesthesia, an arthroscopy of the left knee was performed.  Suprapatellar pouch was benign, and the patellofemoral joint exhibited some focal breakdown through the intertrochlear groove, grade 4 and a small area addressed with chondroplasty and abrasion to bleeding bone in 1 small area.  In the medial compartment, she had a displaceable posterior horn medial meniscus tear, consistent with her scan.  This was addressed with about 10% partial medial meniscectomy.  She had some early degenerative changes on the femoral condyle consistent with this chronic injury and a chondroplasty was done, but no bare bone was exposed.  The ACL and PCL looked normal.  In the lateral compartment, I saw no evidence of meniscal or articular cartilage injury.   She was scheduled to go home same day.  DESCRIPTION OF PROCEDURE:  The patient was taken to the operating suite where general anesthetic was applied without difficulty.  She was positioned supine and prepped and draped in normal sterile fashion. After the administration of preop IV Kefzol and an appropriate time-out, an arthroscopy of the left knee was performed through a total of 2 portals.  Findings were as noted above and procedure consisted predominantly of the partial medial meniscectomy which was done with basket and shaver back to stable tissues.  We also performed the abrasion as described above.  The knee was thoroughly irrigated followed by placement of Marcaine with epinephrine and morphine.  Adaptic was placed over the portals followed by dry gauze and loose Ace wrap. Estimated blood loss and fluids can be obtained from anesthesia records.  DISPOSITION:  The patient was extubated in the operating room and taken to recovery in stable condition.  She was to go home same-day and follow up in the office closely.  I will contact her by phone tonight.     Monico Blitz Rhona Raider, M.D.     PGD/MEDQ  D:  03/20/2017  T:  03/20/2017  Job:  [id number]

## 2017-03-28 DIAGNOSIS — Z9889 Other specified postprocedural states: Secondary | ICD-10-CM | POA: Diagnosis not present

## 2017-04-02 ENCOUNTER — Ambulatory Visit: Payer: 59 | Attending: Orthopaedic Surgery | Admitting: Physical Therapy

## 2017-04-02 DIAGNOSIS — M25562 Pain in left knee: Secondary | ICD-10-CM | POA: Diagnosis not present

## 2017-04-02 DIAGNOSIS — M6281 Muscle weakness (generalized): Secondary | ICD-10-CM | POA: Insufficient documentation

## 2017-04-02 DIAGNOSIS — R29898 Other symptoms and signs involving the musculoskeletal system: Secondary | ICD-10-CM | POA: Insufficient documentation

## 2017-04-02 NOTE — Patient Instructions (Signed)
Strengthening: Straight Leg Raise (Phase 1)    Tighten muscles on front of right thigh, then lift leg __8-10__ inches from surface, keeping knee locked.  Repeat __15__ times per set. Do __2__ sets per session.   Straight Leg Raise: With External Leg Rotation    Lie on back with right leg straight, opposite leg bent. Rotate straight leg out and lift __8-10__ inches. Repeat __15__ times per set. Do __2__ sets per session.   Hamstring Step 2    Left foot relaxed, knee straight, other leg bent, foot flat. Raise straight leg further upward to maximal range. Hold _30__ seconds. Relax leg completely down. Repeat _3__ times.  Long [employer]    Straighten operated leg and try to hold it __5__ seconds.  Repeat _15___ times. Do __2__ sessions a day. **with ball/towel squeeze knees**  Gastroc / Heel Cord Stretch - On Step    Stand with heels over edge of stair. Holding rail, lower heels until stretch is felt in calf of legs. Repeat _3__ times. Do _2__ times per day. Hold 30 seconds.   Mini Squat: Double Leg    With feet shoulder width apart, reach forward for balance and do a mini squat. Keep knees in line with second toe. Knees do not go past toes. Repeat _15__ times per set. Do _2__ sets per session.   Foam rolling: Toes down, toes in, toes out - 1 min each

## 2017-04-02 NOTE — Therapy (Signed)
[facility] [address]  [place], [place], [address] Phone: [phone]   Fax:  [phone]  Physical Therapy Evaluation  Patient Details  Name: Crystal Ortiz MRN: [id number] Date of Birth: 1966/01/20 Referring Provider: Dr. Melrose Nakayama  Encounter Date: 04/02/2017      PT End of Session - 04/02/17 1604    Visit Number 1   Number of Visits 8   Date for PT Re-Evaluation 04/30/17   PT Start Time 1450   PT Stop Time 1528   PT Time Calculation (min) 38 min   Activity Tolerance Patient tolerated treatment well   Behavior During Therapy [facility] for tasks assessed/performed      Past Medical History:  Diagnosis Date  . Complication of anesthesia 1995   nasal intubation- told was dificult intubation  . Dysrhythmia 2009   palpitations  . Seasonal allergies     Past Surgical History:  Procedure Laterality Date  . exicision right knee synovium  1987   HP; for pigmented villonodular synovitis   . KNEE ARTHROSCOPY Left 03/20/2017   Procedure: ARTHROSCOPY KNEE;  Surgeon: Melrose Nakayama, MD;  Location: [facility];  Service: Orthopedics;  Laterality: Left;  Marland Kitchen MANDIBLE FRACTURE SURGERY  1995   nasal intubation -told difficult intubation    There were no vitals filed for this visit.       Subjective Assessment - 04/02/17 1450    Subjective patient s/p scope. Has had knee pain since May. originally thought pain was from low back/SI area. MRI with meniscus tear. Wore sleeve for a while. Sent to ortho surgeon - felt scope was appropriate on 03/20/17. Does still have some achiness since surgery. Feels like she walks well - was walking 3-4 miles for 4x/week. Denies N&T. Feels like she can't have knee in 1 position for too long.    Pertinent History pigmented villonodular synovitis R knee   Patient Stated Goals get back to walking for exercise   Currently in Pain? No/denies   Pain Score 0-No pain            [facility] PT  Assessment - 04/02/17 1456      Assessment   Medical Diagnosis s/p L knee scope   Referring Provider Dr. Melrose Nakayama   Onset Date/Surgical Date 03/20/17   Next MD Visit 04/11/17   Prior Therapy no     Precautions   Precautions None     Restrictions   Weight Bearing Restrictions No     Balance Screen   Has the patient fallen in the past 6 months No   Has the patient had a decrease in activity level because of a fear of falling?  No   Is the patient reluctant to leave their home because of a fear of falling?  No     Home Environment   Living Environment Private residence   Type of [place] to enter   Entrance Stairs-Number of Steps 3   [place] Two level;Able to live on main level with bedroom/bathroom     Prior Function   Level of Independence Independent   Vocation Full time employment   Vocation Requirements vary bewtween on desk and feet     Cognition   Overall Cognitive Status Within Functional Limits for tasks assessed     Observation/Other Assessments   Focus on Therapeutic Outcomes (FOTO)  Knee: 51 (49% limited, predicted 29% limited)     Sensation  Light Touch Appears Intact     Coordination   Gross Motor Movements are Fluid and Coordinated Yes     Posture/Postural Control   Posture/Postural Control No significant limitations     ROM / Strength   AROM / PROM / Strength AROM;Strength     AROM   AROM Assessment Site Knee   Right/Left Knee Right;Left   Right Knee Extension -1   Right Knee Flexion 137   Left Knee Extension 0   Left Knee Flexion 130     Strength   Strength Assessment Site Knee;Hip   Right/Left Hip Right;Left   Right Hip Flexion 4+/5   Left Hip Flexion 4-/5   Right/Left Knee Right;Left   Right Knee Flexion 5/5   Right Knee Extension 5/5   Left Knee Flexion 4-/5   Left Knee Extension 4-/5     Palpation   Palpation comment diffusely non-tender            Objective measurements completed on  examination: See above findings.          [facility] Adult PT Treatment/Exercise - 04/02/17 1456      Exercises   Exercises Knee/Hip     Knee/Hip Exercises: Stretches   Passive Hamstring Stretch Left;3 reps;30 seconds   Gastroc Stretch Left;3 reps;30 seconds     Knee/Hip Exercises: Standing   Functional Squat 10 reps     Knee/Hip Exercises: Seated   Long Arc Quad Strengthening;Left;10 reps   Long Arc Quad Limitations with ball squeeze     Knee/Hip Exercises: Supine   Straight Leg Raises Left;10 reps   Straight Leg Raise with External Rotation Left;10 reps                PT Education - 04/02/17 1604    Education provided Yes   Education Details exam findings, POC, HEP   Person(s) Educated Patient   Methods Explanation;Demonstration;Handout   Comprehension Verbalized understanding;Returned demonstration             PT Long Term Goals - 04/02/17 1746      PT LONG TERM GOAL #1   Title patient to be independent with HEP   Status New   Target Date 04/30/17     PT LONG TERM GOAL #2   Title patient to demosntrate good eccentric quad control with L LE demonstrating improved functional mobility   Status New   Target Date 04/30/17     PT LONG TERM GOAL #3   Title patient to improve L LE strength >/= 4+/5   Status New   Target Date 04/30/17     PT LONG TERM GOAL #4   Title patient to demonstrate ability to ascend/descend 1 flight of steps reciprocally with no evidence of instability or LOB   Status New   Target Date 04/30/17                Plan - 04/02/17 1759    Clinical Impression Statement patient is a 51 y/o female presenting to [facility] today s/p L knee scope on 03/20/17. Patient today with good gait mechanics, however, reports instability with stair ambulation. Patient also with excellent L knee AROM, near that of R knee, however, does demonstrate slight strength deficits. Patient to benefit from PT ot address funcitonal mobility concerns to allow for  return to exercise and imrpoved QOL.    Clinical Presentation Stable   Clinical Decision Making Low   Rehab Potential Good   PT Frequency 2x / week   PT  Duration 4 weeks   PT Treatment/Interventions ADLs/Self Care Home Management;Cryotherapy;Electrical Stimulation;Iontophoresis 4mg /ml Dexamethasone;Moist Heat;Ultrasound;Neuromuscular re-education;Balance training;Therapeutic exercise;Therapeutic activities;Functional mobility training;Stair training;Gait training;Patient/family education;Manual techniques;Passive range of motion;Scar mobilization;Vasopneumatic Device;Taping;Dry needling   Consulted and Agree with Plan of Care Patient      Patient will benefit from skilled therapeutic intervention in order to improve the following deficits and impairments:  Abnormal gait, Decreased activity tolerance, Decreased mobility, Decreased range of motion, Decreased strength, Difficulty walking, Pain  Visit Diagnosis: Acute pain of left knee - Plan: PT plan of care cert/re-cert  Other symptoms and signs involving the musculoskeletal system - Plan: PT plan of care cert/re-cert  Muscle weakness (generalized) - Plan: PT plan of care cert/re-cert     Problem List Patient Active Problem List   Diagnosis Date Noted  . Abnormality of gait 04/05/2016  . Knee pain, left 03/28/2016  . Idiopathic scoliosis 06/09/2014  . Leg length inequality 06/09/2014  . Sacroiliac joint dysfunction of left side 06/09/2014  . ELBOW PAIN, RIGHT 06/14/2010  . LATERAL EPICONDYLITIS, RIGHT 06/14/2010  . PIGMENTED VILLONODULAR SYNOVITIS 12/06/2009  . KNEE PAIN, RIGHT, ACUTE 12/06/2009  . CERVICAL RADICULOPATHY 03/03/2009     Lanney Gins, PT, DPT 04/02/17 6:05 PM   [facility] [address]  [place], [place], [address] Phone: [phone]   Fax:  [phone]  Name: Crystal Ortiz MRN: [id number] Date of Birth: 07/15/1966

## 2017-04-05 ENCOUNTER — Ambulatory Visit: Payer: 59 | Admitting: Physical Therapy

## 2017-04-05 DIAGNOSIS — M25562 Pain in left knee: Secondary | ICD-10-CM

## 2017-04-05 DIAGNOSIS — R29898 Other symptoms and signs involving the musculoskeletal system: Secondary | ICD-10-CM | POA: Diagnosis not present

## 2017-04-05 DIAGNOSIS — M6281 Muscle weakness (generalized): Secondary | ICD-10-CM

## 2017-04-05 NOTE — Therapy (Signed)
[facility] [address]  [place], [place], [address] Phone: [phone]   Fax:  [phone]  Physical Therapy Treatment  Patient Details  Name: Crystal Ortiz MRN: [id number] Date of Birth: 17-Jul-1966 Referring Provider: Dr. Melrose Nakayama  Encounter Date: 04/05/2017      PT End of Session - 04/05/17 1409    Visit Number 2   Number of Visits 8   Date for PT Re-Evaluation 04/30/17   PT Start Time 6962   PT Stop Time 1444   PT Time Calculation (min) 41 min   Activity Tolerance Patient tolerated treatment well   Behavior During Therapy [facility] for tasks assessed/performed      Past Medical History:  Diagnosis Date  . Complication of anesthesia 1995   nasal intubation- told was dificult intubation  . Dysrhythmia 2009   palpitations  . Seasonal allergies     Past Surgical History:  Procedure Laterality Date  . exicision right knee synovium  1987   HP; for pigmented villonodular synovitis   . KNEE ARTHROSCOPY Left 03/20/2017   Procedure: ARTHROSCOPY KNEE;  Surgeon: Melrose Nakayama, MD;  Location: [facility];  Service: Orthopedics;  Laterality: Left;  Marland Kitchen MANDIBLE FRACTURE SURGERY  1995   nasal intubation -told difficult intubation    There were no vitals filed for this visit.      Subjective Assessment - 04/05/17 1405    Subjective Has went walking 2x this week for approx 20-30 min. only some tenderness at medial knee   Pertinent History pigmented villonodular synovitis R knee   Patient Stated Goals get back to walking for exercise   Currently in Pain? No/denies   Pain Score 0-No pain                         [facility] Adult PT Treatment/Exercise - 04/05/17 1410      Knee/Hip Exercises: Stretches   Gastroc Stretch Left;3 reps;30 seconds   Gastroc Stretch Limitations prostretch     Knee/Hip Exercises: Aerobic   Stationary Bike L1 x 6 min     Knee/Hip Exercises: Machines for Strengthening   Cybex  Knee Extension 15# B con/L ecc x 15 reps     Knee/Hip Exercises: Standing   Heel Raises Both;15 reps   Heel Raises Limitations negative heel raise; 10 reps with B con/L ecc - negative   Forward Lunges Right;Left;10 reps   Forward Lunges Limitations TRX   Step Down Left;15 reps;Hand Hold: 2;Step Height: 6"   Step Down Limitations eccentric   Functional Squat 15 reps   Functional Squat Limitations TRX   SLS L SLS with R LE cone taps 3 x 15 reps; L SLS on foam 3 x 30 sec     Knee/Hip Exercises: Seated   Long Arc Quad Strengthening;Left;15 reps;Weights   Long Arc Quad Weight 2 lbs.   Long [employer] Limitations with ball squeeze   Other Seated Knee/Hip Exercises Fitter - 1 blue/1 black x 20 reps   Hamstring Curl Left;15 reps   Hamstring Limitations red tband                     PT Long Term Goals - 04/05/17 1409      PT LONG TERM GOAL #1   Title patient to be independent with HEP   Status On-going     PT LONG TERM GOAL #2   Title patient to demosntrate good eccentric  quad control with L LE demonstrating improved functional mobility   Status On-going     PT LONG TERM GOAL #3   Title patient to improve L LE strength >/= 4+/5   Status On-going     PT LONG TERM GOAL #4   Title patient to demonstrate ability to ascend/descend 1 flight of steps reciprocally with no evidence of instability or LOB   Status On-going               Plan - 04/05/17 1410    Clinical Impression Statement Patient doing well today wiht all progressions of strengthening. Patient reporitng initiation of walking program with good tolerance. Will plan to update HEP at next visit depending on patinet response to treatment today.    PT Treatment/Interventions ADLs/Self Care Home Management;Cryotherapy;Electrical Stimulation;Iontophoresis 4mg /ml Dexamethasone;Moist Heat;Ultrasound;Neuromuscular re-education;Balance training;Therapeutic exercise;Therapeutic activities;Functional mobility  training;Stair training;Gait training;Patient/family education;Manual techniques;Passive range of motion;Scar mobilization;Vasopneumatic Device;Taping;Dry needling   Consulted and Agree with Plan of Care Patient      Patient will benefit from skilled therapeutic intervention in order to improve the following deficits and impairments:  Abnormal gait, Decreased activity tolerance, Decreased mobility, Decreased range of motion, Decreased strength, Difficulty walking, Pain  Visit Diagnosis: Acute pain of left knee  Other symptoms and signs involving the musculoskeletal system  Muscle weakness (generalized)     Problem List Patient Active Problem List   Diagnosis Date Noted  . Abnormality of gait 04/05/2016  . Knee pain, left 03/28/2016  . Idiopathic scoliosis 06/09/2014  . Leg length inequality 06/09/2014  . Sacroiliac joint dysfunction of left side 06/09/2014  . ELBOW PAIN, RIGHT 06/14/2010  . LATERAL EPICONDYLITIS, RIGHT 06/14/2010  . PIGMENTED VILLONODULAR SYNOVITIS 12/06/2009  . KNEE PAIN, RIGHT, ACUTE 12/06/2009  . CERVICAL RADICULOPATHY 03/03/2009     Lanney Gins, PT, DPT 04/05/17 2:49 PM   [facility] [address]  [place], [place], [address] Phone: [phone]   Fax:  [phone]  Name: Crystal Ortiz MRN: [id number] Date of Birth: 1965-11-06

## 2017-04-09 ENCOUNTER — Ambulatory Visit: Payer: 59 | Admitting: Physical Therapy

## 2017-04-09 DIAGNOSIS — M25562 Pain in left knee: Secondary | ICD-10-CM | POA: Diagnosis not present

## 2017-04-09 DIAGNOSIS — M6281 Muscle weakness (generalized): Secondary | ICD-10-CM | POA: Diagnosis not present

## 2017-04-09 DIAGNOSIS — R29898 Other symptoms and signs involving the musculoskeletal system: Secondary | ICD-10-CM

## 2017-04-09 NOTE — Therapy (Addendum)
[facility] [address]  [place], [place], [address] Phone: [phone]   Fax:  [phone]  Physical Therapy Treatment  Patient Details  Name: Crystal Ortiz MRN: [id number] Date of Birth: 11-Apr-1966 Referring Provider: Dr. Melrose Ortiz  Encounter Date: 04/09/2017      PT End of Session - 04/09/17 0856    Visit Number 3   Number of Visits 8   Date for PT Re-Evaluation 04/30/17   PT Start Time 0852   PT Stop Time 0931   PT Time Calculation (min) 39 min   Activity Tolerance Patient tolerated treatment well   Behavior During Therapy [facility] for tasks assessed/performed      Past Medical History:  Diagnosis Date  . Complication of anesthesia 1995   nasal intubation- told was dificult intubation  . Dysrhythmia 2009   palpitations  . Seasonal allergies     Past Surgical History:  Procedure Laterality Date  . exicision right knee synovium  1987   HP; for pigmented villonodular synovitis   . KNEE ARTHROSCOPY Left 03/20/2017   Procedure: ARTHROSCOPY KNEE;  Surgeon: Crystal Nakayama, MD;  Location: [facility];  Service: Orthopedics;  Laterality: Left;  Marland Kitchen MANDIBLE FRACTURE SURGERY  1995   nasal intubation -told difficult intubation    There were no vitals filed for this visit.      Subjective Assessment - 04/09/17 0855    Subjective Had some soreness/achiness last night, no other complaints   Pertinent History pigmented villonodular synovitis R knee   Patient Stated Goals get back to walking for exercise   Currently in Pain? No/denies   Pain Score 0-No pain                         [facility] Adult PT Treatment/Exercise - 04/09/17 0001      Exercises   Exercises Knee/Hip     Knee/Hip Exercises: Aerobic   Stationary Bike L2 x 6 min     Knee/Hip Exercises: Machines for Strengthening   Cybex Knee Extension 20# B con/L ecc x 15   Cybex Knee Flexion 20# B LE x 15   Cybex Leg Press 25# B LE x 13; 25#  B con/L ecc x 10     Knee/Hip Exercises: Standing   Forward Lunges Right;Left;15 reps   Forward Lunges Limitations TRX   Step Down Left;2 sets;15 reps;Hand Hold: 2;Step Height: 8"   Step Down Limitations eccentric   Functional Squat 15 reps;2 sets   Functional Squat Limitations TRX; 2nd set + heel raise   Other Standing Knee Exercises standing hip drop x 15 reps     Knee/Hip Exercises: Seated   Long Arc Quad Strengthening;Left;15 reps;Weights   Long Arc Quad Weight 3 lbs.   Long [employer] Limitations with ball squeeze     Knee/Hip Exercises: Supine   Bridges Strengthening;Both;15 reps   Bridges Limitations green tband at knees   Single Leg Bridge Left;15 reps   Straight Leg Raises Left;15 reps   Straight Leg Raises Limitations 2#   Straight Leg Raise with External Rotation Left;15 reps   Straight Leg Raise with External Rotation Limitations 2#                     PT Long Term Goals - 04/05/17 1409      PT LONG TERM GOAL #1   Title patient to be independent with HEP   Status On-going  PT LONG TERM GOAL #2   Title patient to demosntrate good eccentric quad control with L LE demonstrating improved functional mobility   Status On-going     PT LONG TERM GOAL #3   Title patient to improve L LE strength >/= 4+/5   Status On-going     PT LONG TERM GOAL #4   Title patient to demonstrate ability to ascend/descend 1 flight of steps reciprocally with no evidence of instability or LOB   Status On-going               Plan - 04/09/17 0856    Clinical Impression Statement Patient making excellent progress with all strengthening tasks. Patient reporting good compliance with HEP and current walking program with no issues noted. Only slight quad weakness noted throughout session, as well as expected fatiuge that will likely continue to improve with general strength training. Patient to follow-up this week with MD, with patient wishing for MD recommendations before  continuing current POC.    PT Treatment/Interventions ADLs/Self Care Home Management;Cryotherapy;Electrical Stimulation;Iontophoresis 39m/ml Dexamethasone;Moist Heat;Ultrasound;Neuromuscular re-education;Balance training;Therapeutic exercise;Therapeutic activities;Functional mobility training;Stair training;Gait training;Patient/family education;Manual techniques;Passive range of motion;Scar mobilization;Vasopneumatic Device;Taping;Dry needling   Consulted and Agree with Plan of Care Patient      Patient will benefit from skilled therapeutic intervention in order to improve the following deficits and impairments:  Abnormal gait, Decreased activity tolerance, Decreased mobility, Decreased range of motion, Decreased strength, Difficulty walking, Pain  Visit Diagnosis: Acute pain of left knee  Other symptoms and signs involving the musculoskeletal system  Muscle weakness (generalized)     Problem List Patient Active Problem List   Diagnosis Date Noted  . Abnormality of gait 04/05/2016  . Knee pain, left 03/28/2016  . Idiopathic scoliosis 06/09/2014  . Leg length inequality 06/09/2014  . Sacroiliac joint dysfunction of left side 06/09/2014  . ELBOW PAIN, RIGHT 06/14/2010  . LATERAL EPICONDYLITIS, RIGHT 06/14/2010  . PIGMENTED VILLONODULAR SYNOVITIS 12/06/2009  . KNEE PAIN, RIGHT, ACUTE 12/06/2009  . CERVICAL RADICULOPATHY 03/03/2009     SLanney Ortiz PT, DPT 04/09/17 5:26 PM  PHYSICAL THERAPY DISCHARGE SUMMARY  Visits from Start of Care: 3  Current functional level related to goals / functional outcomes: See above   Remaining deficits: See above, slight quad weakness and reduced endurance   Education / Equipment: HEP  Plan: Patient agrees to discharge.  Patient goals were partially met. Patient is being discharged due to being pleased with the current functional level.  ?????    Spoke with patient after MD visit, with MD releasing patient. Will gladly see patient  in the future for any other needs.  SLanney Ortiz PT, DPT 05/15/17 8:27 AM   [facility] [place] [address]Phone: [phone]  Fax:  [phone] Name: LKADIAN BARCELLOSMRN: [id number] of Birth: [birth year]

## 2017-04-09 NOTE — Patient Instructions (Signed)
Knee Extension: Step-Down Forward / Sideways / Backward (Eccentric)   Stand, holding support, affected foot on step. Slowly bend affected knee for 3-5 seconds and bring other heel forward to floor. Quickly straighten affected leg. Repeat, placing foot flat to side. Repeat, touching toe behind. _15__ reps per set, _2__ sets per day  Single Leg    While standing on left leg maintain balance. Hold__30__ seconds. Repeat __3-5__ times per session.  Lunge   Place left leg forward. Bend both knees, keeping head and back straight. May hold ____ pound weight in each hand. Repeat _15___ times. Do _2___ sessions per day. CAUTION: Move slowly. May lightly hold chair for stability.  Bridge   Lie back, legs bent. Inhale, pressing hips up. Keeping ribs in, lengthen lower back. Exhale, rolling down along spine from top. Repeat __15__ times. Do __2__ sessions per day. **can progress to single leg bridge**

## 2017-04-11 DIAGNOSIS — Z9889 Other specified postprocedural states: Secondary | ICD-10-CM | POA: Diagnosis not present

## 2017-04-23 ENCOUNTER — Ambulatory Visit
Admission: RE | Admit: 2017-04-23 | Discharge: 2017-04-23 | Disposition: A | Discharge status: Home / Self Care | Payer: 59 | Source: Ambulatory Visit | Attending: Family Medicine | Admitting: Family Medicine

## 2017-04-23 DIAGNOSIS — Z1231 Encounter for screening mammogram for malignant neoplasm of breast: Secondary | ICD-10-CM | POA: Diagnosis not present

## 2017-06-11 MED FILL — GABAPENTIN 300 MG CAPSULE: 300 | 90 days supply | Qty: 180 | Fill #0

## 2017-06-20 DIAGNOSIS — Z0000 Encounter for general adult medical examination without abnormal findings: Secondary | ICD-10-CM | POA: Diagnosis not present

## 2017-06-20 DIAGNOSIS — Z13220 Encounter for screening for lipoid disorders: Secondary | ICD-10-CM | POA: Diagnosis not present

## 2017-06-20 DIAGNOSIS — N951 Menopausal and female climacteric states: Secondary | ICD-10-CM | POA: Diagnosis not present

## 2017-06-20 DIAGNOSIS — Z131 Encounter for screening for diabetes mellitus: Secondary | ICD-10-CM | POA: Diagnosis not present

## 2017-06-20 DIAGNOSIS — Z1211 Encounter for screening for malignant neoplasm of colon: Secondary | ICD-10-CM | POA: Diagnosis not present

## 2017-06-20 DIAGNOSIS — E663 Overweight: Secondary | ICD-10-CM | POA: Diagnosis not present

## 2017-06-22 DIAGNOSIS — D2271 Melanocytic nevi of right lower limb, including hip: Secondary | ICD-10-CM | POA: Diagnosis not present

## 2017-06-22 DIAGNOSIS — L570 Actinic keratosis: Secondary | ICD-10-CM | POA: Diagnosis not present

## 2017-06-22 DIAGNOSIS — D485 Neoplasm of uncertain behavior of skin: Secondary | ICD-10-CM | POA: Diagnosis not present

## 2017-06-22 DIAGNOSIS — D2262 Melanocytic nevi of left upper limb, including shoulder: Secondary | ICD-10-CM | POA: Diagnosis not present

## 2017-06-22 DIAGNOSIS — L821 Other seborrheic keratosis: Secondary | ICD-10-CM | POA: Diagnosis not present

## 2017-06-22 DIAGNOSIS — D2272 Melanocytic nevi of left lower limb, including hip: Secondary | ICD-10-CM | POA: Diagnosis not present

## 2017-06-22 DIAGNOSIS — D2261 Melanocytic nevi of right upper limb, including shoulder: Secondary | ICD-10-CM | POA: Diagnosis not present

## 2017-06-22 DIAGNOSIS — L819 Disorder of pigmentation, unspecified: Secondary | ICD-10-CM | POA: Diagnosis not present

## 2017-06-22 DIAGNOSIS — D225 Melanocytic nevi of trunk: Secondary | ICD-10-CM | POA: Diagnosis not present

## 2017-08-09 DIAGNOSIS — D485 Neoplasm of uncertain behavior of skin: Secondary | ICD-10-CM | POA: Diagnosis not present

## 2017-08-09 DIAGNOSIS — L988 Other specified disorders of the skin and subcutaneous tissue: Secondary | ICD-10-CM | POA: Diagnosis not present

## 2017-08-09 MED FILL — MUPIROCIN 2% OINTMENT: 2 | 10 days supply | Qty: 22 | Fill #0

## 2017-09-17 MED FILL — GABAPENTIN 300 MG CAPS: 300 | 90 days supply | Qty: 180 | Fill #0

## 2017-10-18 MED FILL — VALACYCLOVIR HCL 500 MG TAB: 500 | 5 days supply | Qty: 10 | Fill #0

## 2017-11-16 ENCOUNTER — Telehealth: Payer: 59 | Admitting: Family

## 2017-11-16 DIAGNOSIS — N390 Urinary tract infection, site not specified: Secondary | ICD-10-CM

## 2017-11-16 DIAGNOSIS — B373 Candidiasis of vulva and vagina: Secondary | ICD-10-CM | POA: Diagnosis not present

## 2017-11-16 DIAGNOSIS — B3731 Acute candidiasis of vulva and vagina: Secondary | ICD-10-CM

## 2017-11-16 MED ORDER — FLUCONAZOLE 150 MG PO TABS: 150.0000 mg | ORAL_TABLET | Freq: Once | ORAL | 0 refills | Status: AC

## 2017-11-16 MED ORDER — CEPHALEXIN 500 MG PO CAPS: 500.0000 mg | ORAL_CAPSULE | Freq: Two times a day (BID) | ORAL | 0 refills | Status: DC

## 2017-11-16 NOTE — Progress Notes (Signed)
Thank you for the details you included in the comment boxes. Those details are very helpful in determining the best course of treatment for you and help [place] to provide the best care. I will also send Diflucan 150mg  x 1 dose (for yeast infection just in case you develop this from the antibiotics we give you; save this until the end of the antibiotic course to see if you need it, white milky discharge would typically be occurring if yeast infection is present.)  We are sorry that you are not feeling well.  Here is how we plan to help!  Based on what you shared with me it looks like you most likely have a simple urinary tract infection.  A UTI (Urinary Tract Infection) is a bacterial infection of the bladder.  Most cases of urinary tract infections are simple to treat but a key part of your care is to encourage you to drink plenty of fluids and watch your symptoms carefully.  I have prescribed Keflex 500 mg twice a day for 7 days.  Your symptoms should gradually improve. Call us if the burning in your urine worsens, you develop worsening fever, back pain or pelvic pain or if your symptoms do not resolve after completing the antibiotic.  Urinary tract infections can be prevented by drinking plenty of water to keep your body hydrated.  Also be sure when you wipe, wipe from front to back and don't hold it in!  If possible, empty your bladder every 4 hours.  Your e-visit answers were reviewed by a board certified advanced clinical practitioner to complete your personal care plan.  Depending on the condition, your plan could have included both over the counter or prescription medications.  If there is a problem please reply  once you have received a response from your provider.  Your safety is important to [place].  If you have drug allergies check your prescription carefully.    You can use MyChart to ask questions about today's visit, request a non-urgent call back, or ask for a work or school excuse for 24  hours related to this e-Visit. If it has been greater than 24 hours you will need to follow up with your provider, or enter a new e-Visit to address those concerns.   You will get an e-mail in the next two days asking about your experience.  I hope that your e-visit has been valuable and will speed your recovery. Thank you for using e-visits.

## 2017-12-14 DIAGNOSIS — Z6826 Body mass index (BMI) 26.0-26.9, adult: Secondary | ICD-10-CM | POA: Diagnosis not present

## 2017-12-14 DIAGNOSIS — N950 Postmenopausal bleeding: Secondary | ICD-10-CM | POA: Diagnosis not present

## 2017-12-17 ENCOUNTER — Other Ambulatory Visit (HOSPITAL_COMMUNITY): Payer: Self-pay | Admitting: Family Medicine

## 2017-12-17 DIAGNOSIS — N950 Postmenopausal bleeding: Secondary | ICD-10-CM

## 2017-12-18 MED FILL — GABAPENTIN 300 MG CAPSULE: 300 | 90 days supply | Qty: 180 | Fill #1

## 2017-12-24 ENCOUNTER — Ambulatory Visit (HOSPITAL_COMMUNITY)
Admission: RE | Admit: 2017-12-24 | Discharge: 2017-12-24 | Disposition: A | Discharge status: Home / Self Care | Payer: 59 | Source: Ambulatory Visit | Attending: Family Medicine | Admitting: Family Medicine

## 2017-12-24 DIAGNOSIS — N950 Postmenopausal bleeding: Secondary | ICD-10-CM | POA: Diagnosis not present

## 2017-12-24 DIAGNOSIS — D259 Leiomyoma of uterus, unspecified: Secondary | ICD-10-CM | POA: Insufficient documentation

## 2017-12-24 DIAGNOSIS — R9389 Abnormal findings on diagnostic imaging of other specified body structures: Secondary | ICD-10-CM | POA: Insufficient documentation

## 2018-01-01 ENCOUNTER — Other Ambulatory Visit: Payer: Self-pay | Admitting: Family Medicine

## 2018-01-01 DIAGNOSIS — N950 Postmenopausal bleeding: Secondary | ICD-10-CM | POA: Diagnosis not present

## 2018-01-01 DIAGNOSIS — Z3202 Encounter for pregnancy test, result negative: Secondary | ICD-10-CM | POA: Diagnosis not present

## 2018-01-01 DIAGNOSIS — N858 Other specified noninflammatory disorders of uterus: Secondary | ICD-10-CM | POA: Diagnosis not present

## 2018-03-12 ENCOUNTER — Other Ambulatory Visit: Payer: Self-pay | Admitting: Family Medicine

## 2018-03-12 DIAGNOSIS — Z1231 Encounter for screening mammogram for malignant neoplasm of breast: Secondary | ICD-10-CM

## 2018-03-13 ENCOUNTER — Ambulatory Visit: Payer: 59 | Admitting: Family Medicine

## 2018-03-13 VITALS — BP 114/76 | Ht 68.5 in | Wt 168.0 lb

## 2018-03-13 DIAGNOSIS — M25562 Pain in left knee: Secondary | ICD-10-CM | POA: Diagnosis not present

## 2018-03-14 ENCOUNTER — Encounter: Payer: Self-pay | Admitting: Family Medicine

## 2018-03-14 NOTE — Progress Notes (Signed)
PCP: Kathyrn Lass, MD  Subjective:   HPI: Patient is a 52 y.o. female here for left knee pain.  Patient reports she's had about 2 weeks of anterior left knee pain. Pain felt like a toothache, radiating down to the foot. No change in activity level prior to this. Pain worse after walking a lot but today feels much better than it has. Felt like she's been weak with left hip flexion. No skin changes, numbness. No catching, locking.  Past Medical History:  Diagnosis Date  . Complication of anesthesia 1995   nasal intubation- told was dificult intubation  . Dysrhythmia 2009   palpitations  . Seasonal allergies     Current Outpatient Medications on File Prior to Visit  Medication Sig Dispense Refill  . CASCARA SAGRADA PO Take 1-3 capsules by mouth at bedtime.    . cetirizine (ZYRTEC) 10 MG tablet Take 10 mg by mouth at bedtime.     . gabapentin (NEURONTIN) 300 MG capsule Take 600 mg by mouth at bedtime.   1  . fluticasone (FLONASE) 50 MCG/ACT nasal spray 1 spray by Both Nostrils route daily.    Marland Kitchen ibuprofen (ADVIL,MOTRIN) 600 MG tablet Take 600 mg by mouth every 6 (six) hours as needed.     No current facility-administered medications on file prior to visit.     Past Surgical History:  Procedure Laterality Date  . exicision right knee synovium  1987   HP; for pigmented villonodular synovitis   . KNEE ARTHROSCOPY Left 03/20/2017   Procedure: ARTHROSCOPY KNEE;  Surgeon: Melrose Nakayama, MD;  Location: [facility];  Service: Orthopedics;  Laterality: Left;  Marland Kitchen MANDIBLE FRACTURE SURGERY  1995   nasal intubation -told difficult intubation    Allergies  Allergen Reactions  . Sulfonamide Derivatives     REACTION: Rash    Social History   Socioeconomic History  . Marital status: Married    Spouse name: Not on file  . Number of children: Not on file  . Years of education: Not on file  . Highest education level: Not on file  Occupational History  . Not on file  Social Needs  .  Financial resource strain: Not on file  . Food insecurity:    Worry: Not on file    Inability: Not on file  . Transportation needs:    Medical: Not on file    Non-medical: Not on file  Tobacco Use  . Smoking status: Never Smoker  . Smokeless tobacco: Never Used  Substance and Sexual Activity  . Alcohol use: Yes    Alcohol/week: 0.0 standard drinks    Comment: rare  . Drug use: No  . Sexual activity: Not on file  Lifestyle  . Physical activity:    Days per week: Not on file    Minutes per session: Not on file  . Stress: Not on file  Relationships  . Social connections:    Talks on phone: Not on file    Gets together: Not on file    Attends religious service: Not on file    Active member of club or organization: Not on file    Attends meetings of clubs or organizations: Not on file    Relationship status: Not on file  . Intimate partner violence:    Fear of current or ex partner: Not on file    Emotionally abused: Not on file    Physically abused: Not on file    Forced sexual activity: Not on file  Other Topics  Concern  . Not on file  Social History Narrative  . Not on file    History reviewed. No pertinent family history.  BP 114/76   Ht 5' 8.5" (1.74 m)   Wt 168 lb (76.2 kg)   BMI 25.17 kg/m   Review of Systems: See HPI above.     Objective:  Physical Exam:  Gen: NAD, comfortable in exam room  Left knee: No gross deformity, ecchymoses, effusion. No TTP currently. FROM with 5/5 strength including hip flexion. Negative ant/post drawers. Negative valgus/varus testing. Negative lachmanns. Negative mcmurrays, apleys, patellar apprehension. NV intact distally.  Right knee: No deformity. FROM with 5/5 strength. No tenderness to palpation. NVI distally.   Assessment & Plan:  1. Left knee pain - patient's pain is much better today and exam is benign, reassuring.  Advised to call us if pain recurs or worsens to evaluate her when she is in pain.  Possible  this is due to patellofemoral syndrome vs mild arthritis.  Tylenol, aleve if needed.  F/u prn otherwise.

## 2018-03-25 MED FILL — GABAPENTIN 300 MG CAPSULE: 300 | 90 days supply | Qty: 180 | Fill #2

## 2018-04-24 ENCOUNTER — Ambulatory Visit
Admission: RE | Admit: 2018-04-24 | Discharge: 2018-04-24 | Disposition: A | Discharge status: Home / Self Care | Payer: 59 | Source: Ambulatory Visit | Attending: Family Medicine | Admitting: Family Medicine

## 2018-04-24 DIAGNOSIS — Z1231 Encounter for screening mammogram for malignant neoplasm of breast: Secondary | ICD-10-CM

## 2018-05-08 MED FILL — PEG-3350 SOLUTION: 420 | 2 days supply | Qty: 4000 | Fill #0

## 2018-05-17 DIAGNOSIS — Z1211 Encounter for screening for malignant neoplasm of colon: Secondary | ICD-10-CM | POA: Diagnosis not present

## 2018-06-21 ENCOUNTER — Ambulatory Visit: Payer: 59 | Admitting: Family Medicine

## 2018-06-27 ENCOUNTER — Ambulatory Visit: Payer: 59 | Admitting: Sports Medicine

## 2018-07-01 MED FILL — GABAPENTIN 300 MG CAPSULE: 300 | 90 days supply | Qty: 180 | Fill #0

## 2018-07-04 ENCOUNTER — Ambulatory Visit: Payer: 59 | Admitting: Sports Medicine

## 2018-07-26 DIAGNOSIS — Z6827 Body mass index (BMI) 27.0-27.9, adult: Secondary | ICD-10-CM | POA: Diagnosis not present

## 2018-07-26 DIAGNOSIS — B360 Pityriasis versicolor: Secondary | ICD-10-CM | POA: Diagnosis not present

## 2018-07-26 DIAGNOSIS — Z0000 Encounter for general adult medical examination without abnormal findings: Secondary | ICD-10-CM | POA: Diagnosis not present

## 2018-07-26 DIAGNOSIS — E663 Overweight: Secondary | ICD-10-CM | POA: Diagnosis not present

## 2018-07-26 DIAGNOSIS — N951 Menopausal and female climacteric states: Secondary | ICD-10-CM | POA: Diagnosis not present

## 2018-07-26 MED FILL — KETOCONAZOLE 2% CREAM: 2 | 30 days supply | Qty: 60 | Fill #0

## 2018-12-03 DIAGNOSIS — B078 Other viral warts: Secondary | ICD-10-CM | POA: Diagnosis not present

## 2018-12-26 MED FILL — GABAPENTIN 300 MG CAPSULE: 300 | 90 days supply | Qty: 180 | Fill #0

## 2018-12-30 DIAGNOSIS — B078 Other viral warts: Secondary | ICD-10-CM | POA: Diagnosis not present

## 2019-03-12 ENCOUNTER — Other Ambulatory Visit: Payer: Self-pay | Admitting: Family Medicine

## 2019-03-12 DIAGNOSIS — Z1231 Encounter for screening mammogram for malignant neoplasm of breast: Secondary | ICD-10-CM

## 2019-03-28 DIAGNOSIS — H5203 Hypermetropia, bilateral: Secondary | ICD-10-CM | POA: Diagnosis not present

## 2019-03-28 DIAGNOSIS — H52223 Regular astigmatism, bilateral: Secondary | ICD-10-CM | POA: Diagnosis not present

## 2019-03-28 DIAGNOSIS — H524 Presbyopia: Secondary | ICD-10-CM | POA: Diagnosis not present

## 2019-04-28 ENCOUNTER — Ambulatory Visit
Admission: RE | Admit: 2019-04-28 | Discharge: 2019-04-28 | Disposition: A | Discharge status: Home / Self Care | Payer: 59 | Source: Ambulatory Visit | Attending: Family Medicine | Admitting: Family Medicine

## 2019-04-28 ENCOUNTER — Other Ambulatory Visit: Payer: Self-pay

## 2019-04-28 DIAGNOSIS — Z1231 Encounter for screening mammogram for malignant neoplasm of breast: Secondary | ICD-10-CM | POA: Diagnosis not present

## 2019-05-01 ENCOUNTER — Other Ambulatory Visit: Payer: Self-pay | Admitting: Family Medicine

## 2019-05-01 DIAGNOSIS — R928 Other abnormal and inconclusive findings on diagnostic imaging of breast: Secondary | ICD-10-CM

## 2019-05-05 ENCOUNTER — Ambulatory Visit
Admission: RE | Admit: 2019-05-05 | Discharge: 2019-05-05 | Disposition: A | Discharge status: Home / Self Care | Payer: 59 | Source: Ambulatory Visit | Attending: Family Medicine | Admitting: Family Medicine

## 2019-05-05 ENCOUNTER — Other Ambulatory Visit: Payer: Self-pay

## 2019-05-05 DIAGNOSIS — N6011 Diffuse cystic mastopathy of right breast: Secondary | ICD-10-CM | POA: Diagnosis not present

## 2019-05-05 DIAGNOSIS — R928 Other abnormal and inconclusive findings on diagnostic imaging of breast: Secondary | ICD-10-CM

## 2019-06-02 MED FILL — GABAPENTIN 300 MG CAPSULE: 300 | 90 days supply | Qty: 180 | Fill #1

## 2019-12-02 ENCOUNTER — Other Ambulatory Visit: Payer: Self-pay | Admitting: Family Medicine

## 2019-12-02 ENCOUNTER — Other Ambulatory Visit (HOSPITAL_COMMUNITY)
Admission: RE | Admit: 2019-12-02 | Discharge: 2019-12-02 | Disposition: A | Discharge status: Home / Self Care | Payer: 59 | Source: Ambulatory Visit | Attending: Family Medicine | Admitting: Family Medicine

## 2019-12-02 DIAGNOSIS — Z0000 Encounter for general adult medical examination without abnormal findings: Secondary | ICD-10-CM | POA: Diagnosis not present

## 2019-12-02 DIAGNOSIS — Z13220 Encounter for screening for lipoid disorders: Secondary | ICD-10-CM | POA: Diagnosis not present

## 2019-12-02 DIAGNOSIS — Z6822 Body mass index (BMI) 22.0-22.9, adult: Secondary | ICD-10-CM | POA: Diagnosis not present

## 2019-12-02 DIAGNOSIS — N951 Menopausal and female climacteric states: Secondary | ICD-10-CM | POA: Diagnosis not present

## 2019-12-02 DIAGNOSIS — Z124 Encounter for screening for malignant neoplasm of cervix: Secondary | ICD-10-CM | POA: Insufficient documentation

## 2019-12-02 DIAGNOSIS — B351 Tinea unguium: Secondary | ICD-10-CM | POA: Diagnosis not present

## 2019-12-02 MED FILL — TERBINAFINE HCL 250 MG TAB: 250 | 90 days supply | Qty: 90 | Fill #0

## 2019-12-02 MED FILL — GABAPENTIN 300 MG CAPSULE: 300 | 90 days supply | Qty: 180 | Fill #0

## 2019-12-04 LAB — CYTOLOGY - PAP
Comment: NEGATIVE
Diagnosis: NEGATIVE
High risk HPV: NEGATIVE

## 2020-03-05 MED FILL — TERBINAFINE HCL 250 MG TAB: 250 | 90 days supply | Qty: 90 | Fill #0

## 2020-03-24 ENCOUNTER — Other Ambulatory Visit: Payer: Self-pay | Admitting: Family Medicine

## 2020-03-24 DIAGNOSIS — Z1231 Encounter for screening mammogram for malignant neoplasm of breast: Secondary | ICD-10-CM

## 2020-04-07 DIAGNOSIS — D225 Melanocytic nevi of trunk: Secondary | ICD-10-CM | POA: Diagnosis not present

## 2020-04-07 DIAGNOSIS — L821 Other seborrheic keratosis: Secondary | ICD-10-CM | POA: Diagnosis not present

## 2020-04-07 DIAGNOSIS — L603 Nail dystrophy: Secondary | ICD-10-CM | POA: Diagnosis not present

## 2020-04-07 DIAGNOSIS — L309 Dermatitis, unspecified: Secondary | ICD-10-CM | POA: Diagnosis not present

## 2020-04-07 DIAGNOSIS — L819 Disorder of pigmentation, unspecified: Secondary | ICD-10-CM | POA: Diagnosis not present

## 2020-04-07 DIAGNOSIS — D2261 Melanocytic nevi of right upper limb, including shoulder: Secondary | ICD-10-CM | POA: Diagnosis not present

## 2020-04-07 DIAGNOSIS — L812 Freckles: Secondary | ICD-10-CM | POA: Diagnosis not present

## 2020-04-07 DIAGNOSIS — D2271 Melanocytic nevi of right lower limb, including hip: Secondary | ICD-10-CM | POA: Diagnosis not present

## 2020-04-07 DIAGNOSIS — D2262 Melanocytic nevi of left upper limb, including shoulder: Secondary | ICD-10-CM | POA: Diagnosis not present

## 2020-04-28 ENCOUNTER — Other Ambulatory Visit: Payer: Self-pay

## 2020-04-28 ENCOUNTER — Ambulatory Visit
Admission: RE | Admit: 2020-04-28 | Discharge: 2020-04-28 | Disposition: A | Discharge status: Home / Self Care | Payer: 59 | Source: Ambulatory Visit | Attending: Family Medicine | Admitting: Family Medicine

## 2020-04-28 DIAGNOSIS — Z1231 Encounter for screening mammogram for malignant neoplasm of breast: Secondary | ICD-10-CM

## 2020-06-03 MED FILL — GABAPENTIN 300 MG CAPSULE: 300 | 90 days supply | Qty: 180 | Fill #1

## 2020-06-08 MED FILL — valACYclovir HCL 1 GM TABS: 1 | 30 days supply | Qty: 30 | Fill #0

## 2020-07-06 ENCOUNTER — Other Ambulatory Visit (HOSPITAL_COMMUNITY): Payer: Self-pay

## 2020-07-06 MED FILL — MAGIC MOUTHWASH D/M/L: 2 days supply | Qty: 120 | Fill #0

## 2020-07-06 MED FILL — CHLORHEXIDINE 0.12% RINSE: 0.12 | 30 days supply | Qty: 473 | Fill #0

## 2020-07-15 ENCOUNTER — Encounter: Payer: Self-pay | Admitting: Podiatry

## 2020-07-15 ENCOUNTER — Ambulatory Visit: Payer: 59 | Admitting: Podiatry

## 2020-07-15 ENCOUNTER — Other Ambulatory Visit: Payer: Self-pay

## 2020-07-15 DIAGNOSIS — B351 Tinea unguium: Secondary | ICD-10-CM | POA: Diagnosis not present

## 2020-07-15 DIAGNOSIS — L603 Nail dystrophy: Secondary | ICD-10-CM

## 2020-07-15 DIAGNOSIS — L608 Other nail disorders: Secondary | ICD-10-CM | POA: Diagnosis not present

## 2020-07-20 NOTE — Progress Notes (Signed)
Subjective:   Patient ID: Crystal Ortiz, female   DOB: 55 y.o.   MRN: [id number]   HPI 55 year old female presents the office today for concerns of her right big toenail becoming discolored and darkened.  She states that she originally did a televisit she was on Lamisil for 6 months which she finished in late November.  Did not see much improvement with this.  She did follow-up with a dermatologist who was told she had Pseudomonas.  She states that the toenail is not improving.  Denies any pain in the nail this time denies any redness or drainage or any signs of infection otherwise.  She has no other concerns.   Review of Systems  All other systems reviewed and are negative.  Past Medical History:  Diagnosis Date  . Complication of anesthesia 1995   nasal intubation- told was dificult intubation  . Dysrhythmia 2009   palpitations  . Seasonal allergies     Past Surgical History:  Procedure Laterality Date  . exicision right knee synovium  1987   HP; for pigmented villonodular synovitis   . KNEE ARTHROSCOPY Left 03/20/2017   Procedure: ARTHROSCOPY KNEE;  Surgeon: Marcene Corning, MD;  Location: [facility];  Service: Orthopedics;  Laterality: Left;  Marland Kitchen MANDIBLE FRACTURE SURGERY  1995   nasal intubation -told difficult intubation     Current Outpatient Medications:  .  CASCARA SAGRADA PO, Take 1-3 capsules by mouth at bedtime., Disp: , Rfl:  .  cetirizine (ZYRTEC) 10 MG tablet, Take 10 mg by mouth at bedtime. , Disp: , Rfl:  .  fluticasone (FLONASE) 50 MCG/ACT nasal spray, 1 spray by Both Nostrils route daily., Disp: , Rfl:  .  gabapentin (NEURONTIN) 300 MG capsule, Take 600 mg by mouth at bedtime. , Disp: , Rfl: 1 .  ibuprofen (ADVIL,MOTRIN) 600 MG tablet, Take 600 mg by mouth every 6 (six) hours as needed., Disp: , Rfl:   Allergies  Allergen Reactions  . Sulfonamide Derivatives     REACTION: Rash         Objective:  Physical Exam  General: AAO x3, NAD  Dermatological: The  right hallux toenail there is a dark discoloration which appears to be dried blood present underneath the distal one half of the toenail.  There is no extension of any hyperpigmentation in the surrounding skin.  There is no edema, erythema of the toenail.  She did show me pictures that did appear to have more greenish tent previously.  There are no open sores, no preulcerative lesions, no rash or signs of infection present.  Vascular: Dorsalis Pedis artery and Posterior Tibial artery pedal pulses are 2/4 bilateral with immedate capillary fill time.  There is no pain with calf compression, swelling, warmth, erythema.   Neruologic: Grossly intact via light touch bilateral.   Musculoskeletal:  Muscular strength 5/5 in all groups tested bilateral.  Gait: Unassisted, Nonantalgic.       Assessment:   55 year old female with onychodystrophy, toenail discoloration    Plan:  -Treatment options discussed including all alternatives, risks, and complications -Etiology of symptoms were discussed -At this point I recommend a biopsy of the toenail.  I did sharply debride the nail and sent this for culture, pathology to [facility] labs.  Discussed partial removal of entire nail for further biopsy but will start with this.  Vivi Barrack DPM

## 2020-08-20 ENCOUNTER — Telehealth: Payer: Self-pay | Admitting: Podiatry

## 2020-08-20 NOTE — Telephone Encounter (Signed)
Pt would like to know next steps in treatment after fungal culture. Please advise.

## 2020-08-23 NOTE — Telephone Encounter (Signed)
I called and spoke with the patient and relayed the message per Dr Jacqualyn Posey and stated that the patient could come by the [facility] office and pick up the urea cream and the patient stated that it would later on in the week or early next week. Lattie Haw

## 2020-08-23 NOTE — Telephone Encounter (Signed)
The culture did not show fungus. At this point I believe all the Lamisil has treated the fungus but left with damage to the nail. I would treat with urea at this point. She could consider laser but with no fungus I don't think it will be helpful.

## 2020-09-09 DIAGNOSIS — R29898 Other symptoms and signs involving the musculoskeletal system: Secondary | ICD-10-CM | POA: Diagnosis not present

## 2020-09-09 DIAGNOSIS — R079 Chest pain, unspecified: Secondary | ICD-10-CM | POA: Diagnosis not present

## 2020-09-11 NOTE — Progress Notes (Signed)
Cardiology Office Note:    Date:  09/13/2020   ID:  [name], [name] 12-Apr-1966, MRN [id number]  PCP:  Kathyrn Lass, MD   [place]  Cardiologist:  No primary care provider on file.  Advanced Practice Provider:  No care team member to display Electrophysiologist:  None    Referring MD: Kathyrn Lass, MD    History of Present Illness:    Crystal Ortiz is a 55 y.o. female with history of anxiety who was referred by Dr. Sabra Heck for further evaluation of chest pain.  The patient states that she has been having episodes of mid-sternal chest pain. Also having feeling of overwhelming heaviness in both upper extremities. Symptoms usually occur when she is laying down at night or working at her desk. Each episode last 15-30 seconds before abating. No DOE, SOB, lightheadedness, syncope, LE edema. No current palpitations. No exertional symptoms.   Saw Dr. Marlou Porch several years ago where her stress test was normal, TTE normal and cardiac monitor without significant arrhythmias.  Family history: Father with CAD s/p PCI and amarosis fugax, DMII, HTN; Mother with DMII, TIA. Younger brother is healthy.   Past Medical History:  Diagnosis Date  . Complication of anesthesia 1995   nasal intubation- told was dificult intubation  . Dysrhythmia 2009   palpitations  . Seasonal allergies     Past Surgical History:  Procedure Laterality Date  . exicision right knee synovium  1987   HP; for pigmented villonodular synovitis   . KNEE ARTHROSCOPY Left 03/20/2017   Procedure: ARTHROSCOPY KNEE;  Surgeon: Melrose Nakayama, MD;  Location: [facility];  Service: Orthopedics;  Laterality: Left;  Marland Kitchen MANDIBLE FRACTURE SURGERY  1995   nasal intubation -told difficult intubation    Current Medications: Current Meds  Medication Sig  . aspirin 81 MG chewable tablet Chew 81 mg by mouth daily.  . betamethasone 0.5% cream-vitamin a&d ointment 1:1 mixture Apply 1 application topically as  needed for rash.  Rolena Infante [place] 450 MG CAPS Take 1 capsule by mouth as needed (bowel movement).  . cetirizine (ZYRTEC) 10 MG tablet Take 10 mg by mouth at bedtime.   . fluocinonide (LIDEX) 0.05 % external solution Apply topically 2 (two) times daily.  . fluticasone (FLONASE) 50 MCG/ACT nasal spray 1 spray by Both Nostrils route daily.  Marland Kitchen gabapentin (NEURONTIN) 300 MG capsule Take 600 mg by mouth at bedtime.   . gabapentin (NEURONTIN) 300 MG capsule Take 300 mg by mouth as needed (hot flashes).  Marland Kitchen guaiFENesin (MUCINEX) 600 MG 12 hr tablet Take 600 mg by mouth 2 (two) times daily as needed for cough or to loosen phlegm.  Marland Kitchen ibuprofen (ADVIL,MOTRIN) 600 MG tablet Take 600 mg by mouth every 6 (six) hours as needed for headache.  . pseudoephedrine (SUDAFED) 30 MG tablet Take 30 mg by mouth as needed for congestion.  . valACYclovir (VALTREX) 1000 MG tablet Take 1,000 mg by mouth as needed (for cold sore).     Allergies:   Sulfonamide derivatives   Social History   Socioeconomic History  . Marital status: Married    Spouse name: Not on file  . Number of children: Not on file  . Years of education: Not on file  . Highest education level: Not on file  Occupational History  . Not on file  Tobacco Use  . Smoking status: Never Smoker  . Smokeless tobacco: Never Used  Substance and Sexual Activity  . Alcohol use: Yes  Alcohol/week: 0.0 standard drinks    Comment: rare  . Drug use: No  . Sexual activity: Not on file  Other Topics Concern  . Not on file  Social History Narrative  . Not on file   Social Determinants of Health   Financial Resource Strain: Not on file  Food Insecurity: Not on file  Transportation Needs: Not on file  Physical Activity: Not on file  Stress: Not on file  Social Connections: Not on file     Family History: The patient's family history includes Breast cancer in her maternal aunt.  ROS:   Please see the history of present illness.    Review of  Systems  Constitutional: Negative for chills and fever.  HENT: Negative for hearing loss.   Eyes: Negative for blurred vision and redness.  Respiratory: Negative for shortness of breath.   Cardiovascular: Positive for chest pain. Negative for palpitations, orthopnea, claudication, leg swelling and PND.  Gastrointestinal: Negative for melena, nausea and vomiting.  Genitourinary: Negative for dysuria and flank pain.  Musculoskeletal: Negative for falls and myalgias.  Neurological: Negative for dizziness and loss of consciousness.  Endo/Heme/Allergies: Negative for polydipsia.  Psychiatric/Behavioral: Negative for substance abuse.    EKGs/Labs/Other Studies Reviewed:    The following studies were reviewed today: TTE Oct 09, 2007: LEFT VENTRICLE:  - Left ventricular size was normal.  - Overall left ventricular systolic function was normal.  - Left ventricular ejection fraction was estimated to be 60 %.  - There were no left ventricular regional wall motion     abnormalities.  - Left ventricular wall thickness was normal.   Doppler interpretation(s):  - There was a normal transmitral flow pattern.  - The deceleration time of the early transmitral Doppler flow     velocity was normal.  - The pulmonary vein flow pattern was normal.  - Left ventricular diastolic function parameters were normal.   AORTIC VALVE:  - The aortic valve was trileaflet.  - Aortic valve thickness was mildly increased.   Doppler interpretation(s):  - There was trivial aortic valvular regurgitation.   MITRAL VALVE:  - Mitral valve structure was normal.   Doppler interpretation(s):  - There was trivial mitral valvular regurgitation.   LEFT ATRIUM:  - Left atrial size was normal.   - There was redundancy of the interatrial septum, with borderline     criteria for atrial septal aneurysm.   RIGHT VENTRICLE:  - Right ventricular size was normal.  - Right  ventricular systolic function was normal.  - Right ventricular wall thickness was normal.   PULMONIC VALVE:  - The structure of the pulmonic valve appeared to be normal.   Doppler interpretation(s):  - The transpulmonic velocity was within the normal range.  - There was no pulmonic valve stenosis.  - There was no significant pulmonic regurgitation.   TRICUSPID VALVE:  - The tricuspid valve structure was normal.   Doppler interpretation(s):  - There was mild tricuspid valvular regurgitation.   RIGHT ATRIUM:  - Right atrial size was normal.   SYSTEMIC VEINS:  - The inferior vena cava was normal.   PERICARDIUM:  - There was no pericardial effusion.  - The pericardium was normal in appearance.     EKG:  EKG is  ordered today.  The ekg ordered today demonstrates NSR with HR 65  Recent Labs: No results found for requested labs within last 8760 hours.  Recent Lipid Panel    Component Value Date/Time   CHOL 178 05/20/2007 2241  TRIG 97 05/20/2007 2241   HDL 54 05/20/2007 2241   CHOLHDL 3.3 Ratio 05/20/2007 2241   VLDL 19 05/20/2007 2241   LDLCALC 105 (H) 05/20/2007 2241     Physical Exam:    VS:  BP 110/60   Pulse 80   Ht 5' 8.53" (1.741 m)   Wt 157 lb 12.8 oz (71.6 kg)   LMP 03/14/2014   SpO2 95%   BMI 23.62 kg/m     Wt Readings from Last 3 Encounters:  09/13/20 157 lb 12.8 oz (71.6 kg)  03/13/18 168 lb (76.2 kg)  03/14/17 187 lb 4.8 oz (85 kg)     GEN:  Well nourished, well developed in no acute distress HEENT: Normal NECK: No JVD; No carotid bruits LYMPHATICS: No lymphadenopathy CARDIAC:  RRR, 1/6 systolic murmur. No rubs, gallops RESPIRATORY:  Clear to auscultation without rales, wheezing or rhonchi  ABDOMEN: Soft, non-tender, non-distended MUSCULOSKELETAL:  No edema; No deformity  SKIN: Warm and dry NEUROLOGIC:  Alert and oriented x 3 PSYCHIATRIC:  Normal affect   ASSESSMENT:    1. Chest pain of uncertain etiology    2. Bilateral arm weakness    PLAN:    In order of problems listed above:  #Chest Pain: Patient with episodes of substernal chest discomfort over the past several weeks. Symptoms are not exertional and usually occur when laying down or sitting at her desk working. She is otherwise active without significant exercise limitations. Do not suspect her symptoms are secondary to angina but will risk stratify with coronary calcium score.  -Check coronary calcium score for risk stratification  #Bilateral Arm Heaviness: Highest concern for nerve root compression as symptoms are not exertional and appear to be position related. Recommend follow-up with ortho. -Follow-up with ortho -CV risk factor with coronary calcium score    Medication Adjustments/Labs and Tests Ordered: Current medicines are reviewed at length with the patient today.  Concerns regarding medicines are outlined above.  Orders Placed This Encounter  Procedures  . CT CARDIAC SCORING (SELF PAY ONLY)   No orders of the defined types were placed in this encounter.   Patient Instructions  Medication Instructions:  Your physician recommends that you continue on your current medications as directed. Please refer to the Current Medication list given to you today.  *If you need a refill on your cardiac medications before your next appointment, please call your pharmacy*   Lab Work: None ordered  If you have labs (blood work) drawn today and your tests are completely normal, you will receive your results only by: Marland Kitchen MyChart Message (if you have MyChart) OR . A paper copy in the mail If you have any lab test that is abnormal or we need to change your treatment, we will call you to review the results.   Testing/Procedures: Your physician recommends that you have a Cardiac Calcium Score.  It is a out of pocket expense of $99   Follow-Up: At [facility], you and your health needs are our priority.  As part of our continuing  mission to provide you with exceptional heart care, we have created designated Provider Care Teams.  These Care Teams include your primary Cardiologist (physician) and Advanced Practice Providers (APPs -  Physician Assistants and Nurse Practitioners) who all work together to provide you with the care you need, when you need it.  We recommend signing up for the patient portal called "MyChart".  Sign up information is provided on this After Visit Summary.  MyChart is  used to connect with patients for Virtual Visits (Telemedicine).  Patients are able to view lab/test results, encounter notes, upcoming appointments, etc.  Non-urgent messages can be sent to your provider as well.   To learn more about what you can do with MyChart, go to [email or web address].    Your next appointment:   3 month(s)  The format for your next appointment:   In Person  Provider:   Gwyndolyn Kaufman, MD   Other Instructions      Signed, Freada Bergeron, MD  09/13/2020 4:36 PM    [place]

## 2020-09-13 ENCOUNTER — Encounter: Payer: Self-pay | Admitting: Cardiology

## 2020-09-13 ENCOUNTER — Other Ambulatory Visit: Payer: Self-pay

## 2020-09-13 ENCOUNTER — Ambulatory Visit: Payer: 59 | Admitting: Cardiology

## 2020-09-13 VITALS — BP 110/60 | HR 80 | Ht 68.53 in | Wt 157.8 lb

## 2020-09-13 DIAGNOSIS — R079 Chest pain, unspecified: Secondary | ICD-10-CM | POA: Diagnosis not present

## 2020-09-13 DIAGNOSIS — R29898 Other symptoms and signs involving the musculoskeletal system: Secondary | ICD-10-CM

## 2020-09-13 NOTE — Patient Instructions (Signed)
Medication Instructions:  Your physician recommends that you continue on your current medications as directed. Please refer to the Current Medication list given to you today.  *If you need a refill on your cardiac medications before your next appointment, please call your pharmacy*   Lab Work: None ordered  If you have labs (blood work) drawn today and your tests are completely normal, you will receive your results only by: Marland Kitchen MyChart Message (if you have MyChart) OR . A paper copy in the mail If you have any lab test that is abnormal or we need to change your treatment, we will call you to review the results.   Testing/Procedures: Your physician recommends that you have a Cardiac Calcium Score.  It is a out of pocket expense of $99   Follow-Up: At [facility], you and your health needs are our priority.  As part of our continuing mission to provide you with exceptional heart care, we have created designated Provider Care Teams.  These Care Teams include your primary Cardiologist (physician) and Advanced Practice Providers (APPs -  Physician Assistants and Nurse Practitioners) who all work together to provide you with the care you need, when you need it.  We recommend signing up for the patient portal called "MyChart".  Sign up information is provided on this After Visit Summary.  MyChart is used to connect with patients for Virtual Visits (Telemedicine).  Patients are able to view lab/test results, encounter notes, upcoming appointments, etc.  Non-urgent messages can be sent to your provider as well.   To learn more about what you can do with MyChart, go to [email or web address].    Your next appointment:   3 month(s)  The format for your next appointment:   In Person  Provider:   Gwyndolyn Kaufman, MD   Other Instructions

## 2020-09-23 DIAGNOSIS — D485 Neoplasm of uncertain behavior of skin: Secondary | ICD-10-CM | POA: Diagnosis not present

## 2020-09-23 DIAGNOSIS — L738 Other specified follicular disorders: Secondary | ICD-10-CM | POA: Diagnosis not present

## 2020-09-23 DIAGNOSIS — C44612 Basal cell carcinoma of skin of right upper limb, including shoulder: Secondary | ICD-10-CM | POA: Diagnosis not present

## 2020-09-23 DIAGNOSIS — B078 Other viral warts: Secondary | ICD-10-CM | POA: Diagnosis not present

## 2020-10-07 ENCOUNTER — Other Ambulatory Visit: Payer: 59

## 2020-10-13 DIAGNOSIS — C44612 Basal cell carcinoma of skin of right upper limb, including shoulder: Secondary | ICD-10-CM | POA: Diagnosis not present

## 2020-10-13 DIAGNOSIS — B078 Other viral warts: Secondary | ICD-10-CM | POA: Diagnosis not present

## 2020-10-14 ENCOUNTER — Other Ambulatory Visit: Payer: Self-pay

## 2020-10-14 ENCOUNTER — Ambulatory Visit (INDEPENDENT_AMBULATORY_CARE_PROVIDER_SITE_OTHER)
Admission: RE | Admit: 2020-10-14 | Discharge: 2020-10-14 | Disposition: A | Discharge status: Home / Self Care | Payer: Self-pay | Source: Ambulatory Visit | Attending: Cardiology | Admitting: Cardiology

## 2020-10-14 DIAGNOSIS — R079 Chest pain, unspecified: Secondary | ICD-10-CM

## 2020-10-28 DIAGNOSIS — H5203 Hypermetropia, bilateral: Secondary | ICD-10-CM | POA: Diagnosis not present

## 2020-11-15 ENCOUNTER — Other Ambulatory Visit (HOSPITAL_BASED_OUTPATIENT_CLINIC_OR_DEPARTMENT_OTHER): Payer: Self-pay

## 2020-12-02 ENCOUNTER — Other Ambulatory Visit (HOSPITAL_COMMUNITY): Payer: Self-pay

## 2020-12-02 MED ORDER — GABAPENTIN 300 MG PO CAPS
ORAL_CAPSULE | ORAL | 0 refills | Status: DC
  Filled 2020-12-02: qty 180, 90d supply, fill #0

## 2020-12-06 ENCOUNTER — Other Ambulatory Visit (HOSPITAL_COMMUNITY): Payer: Self-pay

## 2020-12-12 NOTE — Progress Notes (Deleted)
Cardiology Office Note:    Date:  12/12/2020   ID:  Hector Brunswick, DOB 1966/05/03, MRN [id number]  PCP:  Kathyrn Lass, MD   [place]  Cardiologist:  None  Advanced Practice Provider:  No care team member to display Electrophysiologist:  None    Referring MD: Kathyrn Lass, MD    History of Present Illness:    Crystal Ortiz is a 55 y.o. female with history of anxiety who presents to clinic for follow-up of chest pain.  Patient was last seen on 09/13/20 where she was having episodes of mid sternal chest pain. Coronary CTA 10/14/20 with no significant CAD, Calcium score 0.   Family history: Father with CAD s/p PCI and amarosis fugax, DMII, HTN; Mother with DMII, TIA. Younger brother is healthy.   Past Medical History:  Diagnosis Date  . Complication of anesthesia 1995   nasal intubation- told was dificult intubation  . Dysrhythmia 2009   palpitations  . Seasonal allergies     Past Surgical History:  Procedure Laterality Date  . exicision right knee synovium  1987   HP; for pigmented villonodular synovitis   . KNEE ARTHROSCOPY Left 03/20/2017   Procedure: ARTHROSCOPY KNEE;  Surgeon: Melrose Nakayama, MD;  Location: [place];  Service: Orthopedics;  Laterality: Left;  Marland Kitchen MANDIBLE FRACTURE SURGERY  1995   nasal intubation -told difficult intubation    Current Medications: No outpatient medications have been marked as taking for the 12/16/20 encounter (Appointment) with Freada Bergeron, MD.     Allergies:   Sulfonamide derivatives   Social History   Socioeconomic History  . Marital status: Married    Spouse name: Not on file  . Number of children: Not on file  . Years of education: Not on file  . Highest education level: Not on file  Occupational History  . Not on file  Tobacco Use  . Smoking status: Never Smoker  . Smokeless tobacco: Never Used  Substance and Sexual Activity  . Alcohol use: Yes    Alcohol/week: 0.0 standard  drinks    Comment: rare  . Drug use: No  . Sexual activity: Not on file  Other Topics Concern  . Not on file  Social History Narrative  . Not on file   Social Determinants of Health   Financial Resource Strain: Not on file  Food Insecurity: Not on file  Transportation Needs: Not on file  Physical Activity: Not on file  Stress: Not on file  Social Connections: Not on file     Family History: The patient's family history includes Breast cancer in her maternal aunt.  ROS:   Please see the history of present illness.    Review of Systems  Constitutional: Negative for chills and fever.  HENT: Negative for hearing loss.   Eyes: Negative for blurred vision and redness.  Respiratory: Negative for shortness of breath.   Cardiovascular: Positive for chest pain. Negative for palpitations, orthopnea, claudication, leg swelling and PND.  Gastrointestinal: Negative for melena, nausea and vomiting.  Genitourinary: Negative for dysuria and flank pain.  Musculoskeletal: Negative for falls and myalgias.  Neurological: Negative for dizziness and loss of consciousness.  Endo/Heme/Allergies: Negative for polydipsia.  Psychiatric/Behavioral: Negative for substance abuse.    EKGs/Labs/Other Studies Reviewed:    The following studies were reviewed today: Coronary CTA 10/14/20: FINDINGS: Coronary arteries: Normal origins.  Coronary Calcium Score:  Left main: 0  Left anterior descending artery: 0  Left circumflex artery: 0  Right coronary artery: 0  Total: 0  Percentile: 0  Pericardium: Normal.  Ascending Aorta: Normal caliber.  Non-cardiac: See separate report from [facility].  IMPRESSION: Coronary calcium score of 0. This is a low risk study.  RECOMMENDATIONS: Coronary artery calcium (CAC) score is a strong predictor of incident coronary heart disease (CHD) and provides predictive information beyond traditional risk factors. CAC scoring  is reasonable to use in the decision to withhold, postpone, or initiate statin therapy in intermediate-risk or selected borderline-risk asymptomatic adults (age 17-75 years and LDL-C >=70 to <190 mg/dL) who do not have diabetes or established atherosclerotic cardiovascular disease (ASCVD).* In intermediate-risk (10-year ASCVD risk >=7.5% to <20%) adults or selected borderline-risk (10-year ASCVD risk >=5% to <7.5%) adults in whom a CAC score is measured for the purpose of making a treatment decision the following recommendations have been made:  If CAC=0, it is reasonable to withhold statin therapy and reassess in 5 to 10 years, as long as higher risk conditions are absent (diabetes mellitus, family history of premature CHD in first degree relatives (males <55 years; females <65 years), cigarette smoking, or LDL >=190 mg/dL).  If CAC is 1 to 99, it is reasonable to initiate statin therapy for patients >=43 years of age.  If CAC is >=100 or >=75th percentile, it is reasonable to initiate statin therapy at any age.  Cardiology referral should be considered for patients with CAC scores >=400 or >=75th percentile.  *2018 AHA/ACC/AACVPR/AAPA/ABC/ACPM/ADA/AGS/APhA/ASPC/NLA/PCNA Guideline on the Management of Blood Cholesterol: A Report of the American College of Cardiology/American Heart Association Task Force on Clinical Practice Guidelines. J Am Coll Cardiol. 2019;73(24):3168-3209.  TTE 2009: LEFT VENTRICLE:  - Left ventricular size was normal.  - Overall left ventricular systolic function was normal.  - Left ventricular ejection fraction was estimated to be 60 %.  - There were no left ventricular regional wall motion     abnormalities.  - Left ventricular wall thickness was normal.   Doppler interpretation(s):  - There was a normal transmitral flow pattern.  - The deceleration time of the early transmitral Doppler flow     velocity was normal.   - The pulmonary vein flow pattern was normal.  - Left ventricular diastolic function parameters were normal.   AORTIC VALVE:  - The aortic valve was trileaflet.  - Aortic valve thickness was mildly increased.   Doppler interpretation(s):  - There was trivial aortic valvular regurgitation.   MITRAL VALVE:  - Mitral valve structure was normal.   Doppler interpretation(s):  - There was trivial mitral valvular regurgitation.   LEFT ATRIUM:  - Left atrial size was normal.   - There was redundancy of the interatrial septum, with borderline     criteria for atrial septal aneurysm.   RIGHT VENTRICLE:  - Right ventricular size was normal.  - Right ventricular systolic function was normal.  - Right ventricular wall thickness was normal.   PULMONIC VALVE:  - The structure of the pulmonic valve appeared to be normal.   Doppler interpretation(s):  - The transpulmonic velocity was within the normal range.  - There was no pulmonic valve stenosis.  - There was no significant pulmonic regurgitation.   TRICUSPID VALVE:  - The tricuspid valve structure was normal.   Doppler interpretation(s):  - There was mild tricuspid valvular regurgitation.   RIGHT ATRIUM:  - Right atrial size was normal.   SYSTEMIC VEINS:  - The inferior vena cava was normal.   PERICARDIUM:  - There was no  pericardial effusion.  - The pericardium was normal in appearance.     EKG:  EKG is  ordered today.  The ekg ordered today demonstrates NSR with HR 65  Recent Labs: No results found for requested labs within last 8760 hours.  Recent Lipid Panel    Component Value Date/Time   CHOL 178 05/20/2007 2241   TRIG 97 05/20/2007 2241   HDL 54 05/20/2007 2241   CHOLHDL 3.3 Ratio 05/20/2007 2241   VLDL 19 05/20/2007 2241   LDLCALC 105 (H) 05/20/2007 2241     Physical Exam:    VS:  LMP 03/14/2014     Wt Readings from Last 3 Encounters:   09/13/20 157 lb 12.8 oz (71.6 kg)  03/13/18 168 lb (76.2 kg)  03/14/17 187 lb 4.8 oz (85 kg)     GEN:  Well nourished, well developed in no acute distress HEENT: Normal NECK: No JVD; No carotid bruits LYMPHATICS: No lymphadenopathy CARDIAC:  RRR, 1/6 systolic murmur. No rubs, gallops RESPIRATORY:  Clear to auscultation without rales, wheezing or rhonchi  ABDOMEN: Soft, non-tender, non-distended MUSCULOSKELETAL:  No edema; No deformity  SKIN: Warm and dry NEUROLOGIC:  Alert and oriented x 3 PSYCHIATRIC:  Normal affect   ASSESSMENT:    No diagnosis found. PLAN:    In order of problems listed above:  #Non-cardiac Chest Pain: Improved. Coronary CTA negative for obstructive disease. Calcium score 0.  -Continue lifestyle management  #Bilateral Arm Heaviness: Highest concern for nerve root compression as symptoms are not exertional and appear to be position related. Recommend follow-up with ortho. -Follow-up with ortho -CV risk factor with coronary calcium score    Medication Adjustments/Labs and Tests Ordered: Current medicines are reviewed at length with the patient today.  Concerns regarding medicines are outlined above.  No orders of the defined types were placed in this encounter.  No orders of the defined types were placed in this encounter.   There are no Patient Instructions on file for this visit.   Signed, Freada Bergeron, MD  12/12/2020 3:15 PM    [place]

## 2020-12-16 ENCOUNTER — Other Ambulatory Visit: Payer: Self-pay

## 2020-12-16 ENCOUNTER — Ambulatory Visit: Payer: 59 | Admitting: Cardiology

## 2020-12-16 ENCOUNTER — Encounter: Payer: Self-pay | Admitting: Cardiology

## 2020-12-16 VITALS — BP 110/70 | HR 82 | Ht 68.0 in | Wt 165.2 lb

## 2020-12-16 DIAGNOSIS — R079 Chest pain, unspecified: Secondary | ICD-10-CM

## 2020-12-16 DIAGNOSIS — R29898 Other symptoms and signs involving the musculoskeletal system: Secondary | ICD-10-CM | POA: Diagnosis not present

## 2020-12-16 NOTE — Progress Notes (Signed)
Cardiology Office Note:    Date:  12/16/2020   ID:  Hector Brunswick, DOB [birth year], MRN [id number]  PCP:  Kathyrn Lass, [facility]  Cardiologist:  None  Advanced Practice Provider:  No care team member to display Electrophysiologist:  None    Referring MD: Kathyrn Lass, MD    History of Present Illness:    Crystal Ortiz is a 55 y.o. female with history of anxiety who presents to clinic for follow-up of chest pain.  Patient was last seen on 09/13/20 where she was having episodes of mid sternal chest pain. Coronary CTA 10/14/20 with no significant CAD, Calcium score 0.  Today, she feels well. She continues to have intermittent episodes of arm heaviness with last episode occurring yesterday. Specifically, she was sitting at her desk working when all of a sudden her arms felt like they were going to give out. Typically the UE heaviness does occur while at her desk when she is holding her arms in a specific position. She may see a Sports Medicine doctor given that her cardiac work-up was reassuring.   Otherwise, she is doing well. For exercise, she has started to walk more often and began light weight training in the past month.   She denies any chest pain, shortness of breath, palpitations, or exertional symptoms. No headaches, lightheadedness, or syncope to report. Also has no lower extremity edema, orthopnea or PND.   Family history: Father with CAD s/p PCI and amarosis fugax, DMII, HTN, aneurysm; Mother with DMII, TIA. Younger brother is healthy. Paternal grandparents both had Afib.  Past Medical History:  Diagnosis Date  . Complication of anesthesia 1995   nasal intubation- told was dificult intubation  . Dysrhythmia 2009   palpitations  . Seasonal allergies     Past Surgical History:  Procedure Laterality Date  . exicision right knee synovium  1987   HP; for pigmented villonodular synovitis   . KNEE ARTHROSCOPY Left 03/20/2017   Procedure:  ARTHROSCOPY KNEE;  Surgeon: Melrose Nakayama, MD;  Location: [facility];  Service: Orthopedics;  Laterality: Left;  Marland Kitchen MANDIBLE FRACTURE SURGERY  1995   nasal intubation -told difficult intubation    Current Medications: Current Meds  Medication Sig  . aspirin 81 MG chewable tablet Chew 81 mg by mouth daily.  . betamethasone 0.5% cream-vitamin a&d ointment 1:1 mixture Apply 1 application topically as needed for rash.  Rolena Infante [place] 450 MG CAPS Take 1 capsule by mouth as needed (bowel movement).  . cetirizine (ZYRTEC) 10 MG tablet Take 10 mg by mouth at bedtime.   . fluocinonide (LIDEX) 0.05 % external solution Apply topically 2 (two) times daily.  . fluticasone (FLONASE) 50 MCG/ACT nasal spray 1 spray by Both Nostrils route daily.  Marland Kitchen gabapentin (NEURONTIN) 300 MG capsule Take 300 mg by mouth at bedtime.  . gabapentin (NEURONTIN) 300 MG capsule Take 300 mg by mouth as needed (hot flashes).  Marland Kitchen guaiFENesin (MUCINEX) 600 MG 12 hr tablet Take 600 mg by mouth 2 (two) times daily as needed for cough or to loosen phlegm.  Marland Kitchen ibuprofen (ADVIL,MOTRIN) 600 MG tablet Take 600 mg by mouth every 6 (six) hours as needed for headache.  . pseudoephedrine (SUDAFED) 30 MG tablet Take 30 mg by mouth as needed for congestion.  . valACYclovir (VALTREX) 1000 MG tablet Take 1,000 mg by mouth as needed (for cold sore).     Allergies:   Sulfonamide derivatives   Social History  Socioeconomic History  . Marital status: Married    Spouse name: Not on file  . Number of children: Not on file  . Years of education: Not on file  . Highest education level: Not on file  Occupational History  . Not on file  Tobacco Use  . Smoking status: Never Smoker  . Smokeless tobacco: Never Used  Substance and Sexual Activity  . Alcohol use: Yes    Alcohol/week: 0.0 standard drinks    Comment: rare  . Drug use: No  . Sexual activity: Not on file  Other Topics Concern  . Not on file  Social History Narrative  . Not on  file   Social Determinants of Health   Financial Resource Strain: Not on file  Food Insecurity: Not on file  Transportation Needs: Not on file  Physical Activity: Not on file  Stress: Not on file  Social Connections: Not on file     Family History: The patient's family history includes Breast cancer in her maternal aunt.  ROS:   Please see the history of present illness.    Review of Systems  Constitutional: Negative for chills and fever.  HENT: Negative for hearing loss.   Eyes: Negative for blurred vision and redness.  Respiratory: Negative for shortness of breath.   Cardiovascular: Negative for chest pain, palpitations, orthopnea, claudication, leg swelling and PND.  Gastrointestinal: Negative for melena, nausea and vomiting.  Genitourinary: Negative for dysuria and flank pain.  Musculoskeletal: Negative for falls and myalgias.  Neurological: Positive for weakness. Negative for dizziness and loss of consciousness.  Endo/Heme/Allergies: Negative for polydipsia.  Psychiatric/Behavioral: Negative for substance abuse.    EKGs/Labs/Other Studies Reviewed:    The following studies were reviewed today: Coronary CTA 10/14/20: FINDINGS: Coronary arteries: Normal origins.  Coronary Calcium Score:  Left main: 0  Left anterior descending artery: 0  Left circumflex artery: 0  Right coronary artery: 0  Total: 0  Percentile: 0  Pericardium: Normal.  Ascending Aorta: Normal caliber.  Non-cardiac: See separate report from [facility].  IMPRESSION: Coronary calcium score of 0. This is a low risk study.  RECOMMENDATIONS: Coronary artery calcium (CAC) score is a strong predictor of incident coronary heart disease (CHD) and provides predictive information beyond traditional risk factors. CAC scoring is reasonable to use in the decision to withhold, postpone, or initiate statin therapy in intermediate-risk or selected borderline-risk asymptomatic  adults (age 39-75 years and LDL-C >=70 to <190 mg/dL) who do not have diabetes or established atherosclerotic cardiovascular disease (ASCVD).* In intermediate-risk (10-year ASCVD risk >=7.5% to <20%) adults or selected borderline-risk (10-year ASCVD risk >=5% to <7.5%) adults in whom a CAC score is measured for the purpose of making a treatment decision the following recommendations have been made:  If CAC=0, it is reasonable to withhold statin therapy and reassess in 5 to 10 years, as long as higher risk conditions are absent (diabetes mellitus, family history of premature CHD in first degree relatives (males <55 years; females <65 years), cigarette smoking, or LDL >=190 mg/dL).  If CAC is 1 to 99, it is reasonable to initiate statin therapy for patients >=60 years of age.  If CAC is >=100 or >=75th percentile, it is reasonable to initiate statin therapy at any age.  Cardiology referral should be considered for patients with CAC scores >=400 or >=75th percentile.  *2018 AHA/ACC/AACVPR/AAPA/ABC/ACPM/ADA/AGS/APhA/ASPC/NLA/PCNA Guideline on the Management of Blood Cholesterol: A Report of the American College of Cardiology/American Heart Association Task Force on Clinical Practice Guidelines. J  Am Coll Cardiol. 2019;73(24):3168-3209.  TTE 2009: LEFT VENTRICLE:  - Left ventricular size was normal.  - Overall left ventricular systolic function was normal.  - Left ventricular ejection fraction was estimated to be 60 %.  - There were no left ventricular regional wall motion     abnormalities.  - Left ventricular wall thickness was normal.   Doppler interpretation(s):  - There was a normal transmitral flow pattern.  - The deceleration time of the early transmitral Doppler flow     velocity was normal.  - The pulmonary vein flow pattern was normal.  - Left ventricular diastolic function parameters were normal.   AORTIC VALVE:  - The aortic  valve was trileaflet.  - Aortic valve thickness was mildly increased.   Doppler interpretation(s):  - There was trivial aortic valvular regurgitation.   MITRAL VALVE:  - Mitral valve structure was normal.   Doppler interpretation(s):  - There was trivial mitral valvular regurgitation.   LEFT ATRIUM:  - Left atrial size was normal.   - There was redundancy of the interatrial septum, with borderline     criteria for atrial septal aneurysm.   RIGHT VENTRICLE:  - Right ventricular size was normal.  - Right ventricular systolic function was normal.  - Right ventricular wall thickness was normal.   PULMONIC VALVE:  - The structure of the pulmonic valve appeared to be normal.   Doppler interpretation(s):  - The transpulmonic velocity was within the normal range.  - There was no pulmonic valve stenosis.  - There was no significant pulmonic regurgitation.   TRICUSPID VALVE:  - The tricuspid valve structure was normal.   Doppler interpretation(s):  - There was mild tricuspid valvular regurgitation.   RIGHT ATRIUM:  - Right atrial size was normal.   SYSTEMIC VEINS:  - The inferior vena cava was normal.   PERICARDIUM:  - There was no pericardial effusion.  - The pericardium was normal in appearance.     EKG:   12/16/2020: EKG is not ordered today. 09/13/2020: NSR with HR 65  Recent Labs: No results found for requested labs within last 8760 hours.  Recent Lipid Panel    Component Value Date/Time   CHOL 178 05/20/2007 2241   TRIG 97 05/20/2007 2241   HDL 54 05/20/2007 2241   CHOLHDL 3.3 Ratio 05/20/2007 2241   VLDL 19 05/20/2007 2241   LDLCALC 105 (H) 05/20/2007 2241     Physical Exam:    VS:  BP 110/70   Pulse 82   Ht 5\' 8"  (1.727 m)   Wt 165 lb 3.2 oz (74.9 kg)   LMP 03/14/2014   SpO2 98%   BMI 25.12 kg/m     Wt Readings from Last 3 Encounters:  12/16/20 165 lb 3.2 oz (74.9 kg)  09/13/20 157 lb  12.8 oz (71.6 kg)  03/13/18 168 lb (76.2 kg)     GEN:  Well nourished, well developed in no acute distress HEENT: Normal NECK: No JVD; No carotid bruits LYMPHATICS: No lymphadenopathy CARDIAC:  RRR, 1/6 systolic flow murmur. No rubs, gallops RESPIRATORY:  Clear to auscultation without rales, wheezing or rhonchi  ABDOMEN: Soft, non-tender, non-distended MUSCULOSKELETAL:  No edema; No deformity  SKIN: Warm and dry NEUROLOGIC:  Alert and oriented x 3 PSYCHIATRIC:  Normal affect   ASSESSMENT:    1. Chest pain of uncertain etiology   2. Bilateral arm weakness    PLAN:    In order of problems listed above:  #Non-cardiac Chest Pain: Improved.  Coronary CTA negative for obstructive disease. Calcium score 0.  -Continue lifestyle management  #Bilateral Arm Heaviness: Possible nerve root compression as symptoms are not exertional and appear to be position related. Recommend follow-up with Sports medicine. -Follow-up with Sports Medicine -Reassuring cardiac work-up  Follow-up PRN.   Medication Adjustments/Labs and Tests Ordered: Current medicines are reviewed at length with the patient today.  Concerns regarding medicines are outlined above.  No orders of the defined types were placed in this encounter.  No orders of the defined types were placed in this encounter.   Patient Instructions  Medication Instructions:   Continue current medications  *If you need a refill on your cardiac medications before your next appointment, please call your pharmacy*   Lab Work: none If you have labs (blood work) drawn today and your tests are completely normal, you will receive your results only by: Marland Kitchen MyChart Message (if you have MyChart) OR . A paper copy in the mail If you have any lab test that is abnormal or we need to change your treatment, we will call you to review the results.   Testing/Procedures: none   Follow-Up: At [facility], you and your health needs are our  priority.  As part of our continuing mission to provide you with exceptional heart care, we have created designated Provider Care Teams.  These Care Teams include your primary Cardiologist (physician) and Advanced Practice Providers (APPs -  Physician Assistants and Nurse Practitioners) who all work together to provide you with the care you need, when you need it.  We recommend signing up for the patient portal called "MyChart".  Sign up information is provided on this After Visit Summary.  MyChart is used to connect with patients for Virtual Visits (Telemedicine).  Patients are able to view lab/test results, encounter notes, upcoming appointments, etc.  Non-urgent messages can be sent to your provider as well.   To learn more about what you can do with MyChart, go to [email or web address].    Your next appointment:   As needed   Provider:   Gwyndolyn Kaufman, MD   Other Instructions none   I,Mathew Stumpf,acting as a scribe for Freada Bergeron, MD.,have documented all relevant documentation on the behalf of Freada Bergeron, MD,as directed by  Freada Bergeron, MD while in the presence of Freada Bergeron, MD.  I, Freada Bergeron, MD, have reviewed all documentation for this visit. The documentation on 12/16/20 for the exam, diagnosis, procedures, and orders are all accurate and complete.  Signed, Freada Bergeron, MD  12/16/2020 5:48 PM    [employer]

## 2020-12-16 NOTE — Patient Instructions (Signed)
Medication Instructions:   Continue current medications  *If you need a refill on your cardiac medications before your next appointment, please call your pharmacy*   Lab Work: none If you have labs (blood work) drawn today and your tests are completely normal, you will receive your results only by: Marland Kitchen MyChart Message (if you have MyChart) OR . A paper copy in the mail If you have any lab test that is abnormal or we need to change your treatment, we will call you to review the results.   Testing/Procedures: none   Follow-Up: At [facility], you and your health needs are our priority.  As part of our continuing mission to provide you with exceptional heart care, we have created designated Provider Care Teams.  These Care Teams include your primary Cardiologist (physician) and Advanced Practice Providers (APPs -  Physician Assistants and Nurse Practitioners) who all work together to provide you with the care you need, when you need it.  We recommend signing up for the patient portal called "MyChart".  Sign up information is provided on this After Visit Summary.  MyChart is used to connect with patients for Virtual Visits (Telemedicine).  Patients are able to view lab/test results, encounter notes, upcoming appointments, etc.  Non-urgent messages can be sent to your provider as well.   To learn more about what you can do with MyChart, go to [email or web address].    Your next appointment:   As needed   Provider:   Gwyndolyn Kaufman, MD   Other Instructions none

## 2021-02-04 ENCOUNTER — Other Ambulatory Visit (HOSPITAL_BASED_OUTPATIENT_CLINIC_OR_DEPARTMENT_OTHER): Payer: Self-pay

## 2021-02-09 DIAGNOSIS — Z0000 Encounter for general adult medical examination without abnormal findings: Secondary | ICD-10-CM | POA: Diagnosis not present

## 2021-02-09 DIAGNOSIS — J309 Allergic rhinitis, unspecified: Secondary | ICD-10-CM | POA: Diagnosis not present

## 2021-02-09 DIAGNOSIS — Z6825 Body mass index (BMI) 25.0-25.9, adult: Secondary | ICD-10-CM | POA: Diagnosis not present

## 2021-02-09 DIAGNOSIS — N951 Menopausal and female climacteric states: Secondary | ICD-10-CM | POA: Diagnosis not present

## 2021-03-01 DIAGNOSIS — L910 Hypertrophic scar: Secondary | ICD-10-CM | POA: Diagnosis not present

## 2021-03-16 ENCOUNTER — Other Ambulatory Visit: Payer: Self-pay | Admitting: Family Medicine

## 2021-03-16 DIAGNOSIS — Z1231 Encounter for screening mammogram for malignant neoplasm of breast: Secondary | ICD-10-CM

## 2021-05-03 ENCOUNTER — Ambulatory Visit: Payer: 59

## 2021-05-19 ENCOUNTER — Ambulatory Visit
Admission: RE | Admit: 2021-05-19 | Discharge: 2021-05-19 | Disposition: A | Discharge status: Home / Self Care | Payer: 59 | Source: Ambulatory Visit | Attending: Family Medicine | Admitting: Family Medicine

## 2021-05-19 DIAGNOSIS — Z1231 Encounter for screening mammogram for malignant neoplasm of breast: Secondary | ICD-10-CM | POA: Diagnosis not present

## 2021-05-31 ENCOUNTER — Other Ambulatory Visit (HOSPITAL_COMMUNITY): Payer: Self-pay

## 2021-05-31 MED ORDER — GABAPENTIN 300 MG PO CAPS
600.0000 mg | ORAL_CAPSULE | Freq: Every day | ORAL | 0 refills | Status: DC
  Filled 2021-05-31: qty 180, 90d supply, fill #0

## 2021-08-01 ENCOUNTER — Other Ambulatory Visit (HOSPITAL_BASED_OUTPATIENT_CLINIC_OR_DEPARTMENT_OTHER): Payer: Self-pay

## 2021-09-15 DIAGNOSIS — D2261 Melanocytic nevi of right upper limb, including shoulder: Secondary | ICD-10-CM | POA: Diagnosis not present

## 2021-09-15 DIAGNOSIS — L814 Other melanin hyperpigmentation: Secondary | ICD-10-CM | POA: Diagnosis not present

## 2021-09-15 DIAGNOSIS — L821 Other seborrheic keratosis: Secondary | ICD-10-CM | POA: Diagnosis not present

## 2021-09-15 DIAGNOSIS — L910 Hypertrophic scar: Secondary | ICD-10-CM | POA: Diagnosis not present

## 2021-09-15 DIAGNOSIS — L819 Disorder of pigmentation, unspecified: Secondary | ICD-10-CM | POA: Diagnosis not present

## 2021-09-15 DIAGNOSIS — D2262 Melanocytic nevi of left upper limb, including shoulder: Secondary | ICD-10-CM | POA: Diagnosis not present

## 2021-09-15 DIAGNOSIS — B078 Other viral warts: Secondary | ICD-10-CM | POA: Diagnosis not present

## 2021-09-15 DIAGNOSIS — L820 Inflamed seborrheic keratosis: Secondary | ICD-10-CM | POA: Diagnosis not present

## 2021-09-15 DIAGNOSIS — L570 Actinic keratosis: Secondary | ICD-10-CM | POA: Diagnosis not present

## 2021-11-14 ENCOUNTER — Ambulatory Visit
Admission: RE | Admit: 2021-11-14 | Discharge: 2021-11-14 | Disposition: A | Discharge status: Home / Self Care | Payer: 59 | Source: Ambulatory Visit | Attending: Sports Medicine | Admitting: Sports Medicine

## 2021-11-14 ENCOUNTER — Other Ambulatory Visit: Payer: Self-pay | Admitting: *Deleted

## 2021-11-14 DIAGNOSIS — M79671 Pain in right foot: Secondary | ICD-10-CM | POA: Diagnosis not present

## 2021-11-14 DIAGNOSIS — M25571 Pain in right ankle and joints of right foot: Secondary | ICD-10-CM

## 2021-11-14 DIAGNOSIS — M7989 Other specified soft tissue disorders: Secondary | ICD-10-CM | POA: Diagnosis not present

## 2021-12-06 ENCOUNTER — Other Ambulatory Visit (HOSPITAL_COMMUNITY): Payer: Self-pay

## 2021-12-07 ENCOUNTER — Other Ambulatory Visit (HOSPITAL_COMMUNITY): Payer: Self-pay

## 2021-12-07 MED ORDER — GABAPENTIN 300 MG PO CAPS
600.0000 mg | ORAL_CAPSULE | Freq: Every day | ORAL | 1 refills | Status: DC
  Filled 2021-12-07: qty 180, 90d supply, fill #0
  Filled 2022-05-29: qty 180, 90d supply, fill #1

## 2022-02-22 DIAGNOSIS — Z23 Encounter for immunization: Secondary | ICD-10-CM | POA: Diagnosis not present

## 2022-02-22 DIAGNOSIS — Z0000 Encounter for general adult medical examination without abnormal findings: Secondary | ICD-10-CM | POA: Diagnosis not present

## 2022-02-22 DIAGNOSIS — L2481 Irritant contact dermatitis due to metals: Secondary | ICD-10-CM | POA: Diagnosis not present

## 2022-02-22 DIAGNOSIS — E663 Overweight: Secondary | ICD-10-CM | POA: Diagnosis not present

## 2022-02-22 DIAGNOSIS — N951 Menopausal and female climacteric states: Secondary | ICD-10-CM | POA: Diagnosis not present

## 2022-02-22 DIAGNOSIS — Z6825 Body mass index (BMI) 25.0-25.9, adult: Secondary | ICD-10-CM | POA: Diagnosis not present

## 2022-02-22 DIAGNOSIS — J309 Allergic rhinitis, unspecified: Secondary | ICD-10-CM | POA: Diagnosis not present

## 2022-02-23 ENCOUNTER — Other Ambulatory Visit (HOSPITAL_COMMUNITY): Payer: Self-pay

## 2022-02-23 DIAGNOSIS — H5203 Hypermetropia, bilateral: Secondary | ICD-10-CM | POA: Diagnosis not present

## 2022-02-24 ENCOUNTER — Other Ambulatory Visit (HOSPITAL_COMMUNITY): Payer: Self-pay

## 2022-02-28 ENCOUNTER — Other Ambulatory Visit (HOSPITAL_COMMUNITY): Payer: Self-pay

## 2022-03-01 ENCOUNTER — Other Ambulatory Visit (HOSPITAL_COMMUNITY): Payer: Self-pay

## 2022-03-01 MED ORDER — BETAMETHASONE DIPROPIONATE 0.05 % EX CREA
TOPICAL_CREAM | CUTANEOUS | 0 refills | Status: AC
  Filled 2022-03-01: qty 45, 30d supply, fill #0

## 2022-03-02 ENCOUNTER — Other Ambulatory Visit (HOSPITAL_COMMUNITY): Payer: Self-pay

## 2022-03-03 ENCOUNTER — Other Ambulatory Visit (HOSPITAL_COMMUNITY): Payer: Self-pay

## 2022-04-04 ENCOUNTER — Other Ambulatory Visit (HOSPITAL_COMMUNITY): Payer: Self-pay

## 2022-04-04 ENCOUNTER — Other Ambulatory Visit: Payer: Self-pay | Admitting: *Deleted

## 2022-04-04 MED ORDER — TERBINAFINE HCL 250 MG PO TABS
250.0000 mg | ORAL_TABLET | Freq: Every day | ORAL | 1 refills | Status: DC
  Filled 2022-04-04 – 2022-04-06 (×2): qty 30, 30d supply, fill #0
  Filled 2022-05-01: qty 30, 30d supply, fill #1

## 2022-04-05 ENCOUNTER — Telehealth: Payer: Self-pay | Admitting: *Deleted

## 2022-04-05 NOTE — Telephone Encounter (Signed)
Pt states having another bout of nail infection like she did a while back. Image below:    Per Dr Oneida Alar, will call in Lamisil daily and pt will start treatment. Pt agreed

## 2022-04-06 ENCOUNTER — Other Ambulatory Visit (HOSPITAL_COMMUNITY): Payer: Self-pay

## 2022-04-10 ENCOUNTER — Other Ambulatory Visit: Payer: Self-pay | Admitting: Family Medicine

## 2022-04-10 DIAGNOSIS — Z1231 Encounter for screening mammogram for malignant neoplasm of breast: Secondary | ICD-10-CM

## 2022-04-20 DIAGNOSIS — R7303 Prediabetes: Secondary | ICD-10-CM | POA: Diagnosis not present

## 2022-04-20 DIAGNOSIS — R5383 Other fatigue: Secondary | ICD-10-CM | POA: Diagnosis not present

## 2022-04-20 DIAGNOSIS — R635 Abnormal weight gain: Secondary | ICD-10-CM | POA: Diagnosis not present

## 2022-04-20 DIAGNOSIS — E559 Vitamin D deficiency, unspecified: Secondary | ICD-10-CM | POA: Diagnosis not present

## 2022-04-20 DIAGNOSIS — E663 Overweight: Secondary | ICD-10-CM | POA: Diagnosis not present

## 2022-04-20 DIAGNOSIS — Z1331 Encounter for screening for depression: Secondary | ICD-10-CM | POA: Diagnosis not present

## 2022-04-20 DIAGNOSIS — E8889 Other specified metabolic disorders: Secondary | ICD-10-CM | POA: Diagnosis not present

## 2022-05-02 ENCOUNTER — Other Ambulatory Visit (HOSPITAL_COMMUNITY): Payer: Self-pay

## 2022-05-04 ENCOUNTER — Other Ambulatory Visit (HOSPITAL_COMMUNITY): Payer: Self-pay

## 2022-05-04 DIAGNOSIS — E663 Overweight: Secondary | ICD-10-CM | POA: Diagnosis not present

## 2022-05-04 DIAGNOSIS — E559 Vitamin D deficiency, unspecified: Secondary | ICD-10-CM | POA: Diagnosis not present

## 2022-05-04 DIAGNOSIS — R7303 Prediabetes: Secondary | ICD-10-CM | POA: Diagnosis not present

## 2022-05-04 DIAGNOSIS — R632 Polyphagia: Secondary | ICD-10-CM | POA: Diagnosis not present

## 2022-05-04 MED ORDER — TOPIRAMATE 25 MG PO TABS
25.0000 mg | ORAL_TABLET | Freq: Every day | ORAL | 1 refills | Status: DC
  Filled 2022-05-04: qty 30, 30d supply, fill #0
  Filled 2022-05-29: qty 30, 30d supply, fill #1

## 2022-05-05 ENCOUNTER — Other Ambulatory Visit (HOSPITAL_COMMUNITY): Payer: Self-pay

## 2022-05-22 ENCOUNTER — Ambulatory Visit
Admission: RE | Admit: 2022-05-22 | Discharge: 2022-05-22 | Disposition: A | Discharge status: Home / Self Care | Payer: 59 | Source: Ambulatory Visit | Attending: Family Medicine | Admitting: Family Medicine

## 2022-05-22 DIAGNOSIS — Z1231 Encounter for screening mammogram for malignant neoplasm of breast: Secondary | ICD-10-CM

## 2022-05-24 ENCOUNTER — Other Ambulatory Visit: Payer: Self-pay | Admitting: Family Medicine

## 2022-05-24 DIAGNOSIS — R928 Other abnormal and inconclusive findings on diagnostic imaging of breast: Secondary | ICD-10-CM

## 2022-05-29 ENCOUNTER — Other Ambulatory Visit (HOSPITAL_COMMUNITY): Payer: Self-pay

## 2022-06-07 ENCOUNTER — Ambulatory Visit
Admission: RE | Admit: 2022-06-07 | Discharge: 2022-06-07 | Disposition: A | Discharge status: Home / Self Care | Payer: 59 | Source: Ambulatory Visit | Attending: Family Medicine | Admitting: Family Medicine

## 2022-06-07 ENCOUNTER — Ambulatory Visit: Admission: RE | Admit: 2022-06-07 | Payer: 59 | Source: Ambulatory Visit

## 2022-06-07 DIAGNOSIS — R928 Other abnormal and inconclusive findings on diagnostic imaging of breast: Secondary | ICD-10-CM

## 2022-06-07 DIAGNOSIS — R92331 Mammographic heterogeneous density, right breast: Secondary | ICD-10-CM | POA: Diagnosis not present

## 2022-06-12 ENCOUNTER — Other Ambulatory Visit: Payer: Self-pay | Admitting: Sports Medicine

## 2022-06-13 ENCOUNTER — Other Ambulatory Visit (HOSPITAL_COMMUNITY): Payer: Self-pay

## 2022-06-13 ENCOUNTER — Other Ambulatory Visit: Payer: Self-pay | Admitting: *Deleted

## 2022-06-13 MED ORDER — TERBINAFINE HCL 250 MG PO TABS
250.0000 mg | ORAL_TABLET | Freq: Every day | ORAL | 1 refills | Status: DC
  Filled 2022-06-13: qty 30, 30d supply, fill #0
  Filled 2022-07-13: qty 30, 30d supply, fill #1

## 2022-06-14 ENCOUNTER — Other Ambulatory Visit (HOSPITAL_COMMUNITY): Payer: Self-pay

## 2022-06-27 ENCOUNTER — Other Ambulatory Visit (HOSPITAL_COMMUNITY): Payer: Self-pay

## 2022-06-29 ENCOUNTER — Other Ambulatory Visit (HOSPITAL_COMMUNITY): Payer: Self-pay

## 2022-06-29 DIAGNOSIS — R7303 Prediabetes: Secondary | ICD-10-CM | POA: Diagnosis not present

## 2022-06-29 DIAGNOSIS — E663 Overweight: Secondary | ICD-10-CM | POA: Diagnosis not present

## 2022-06-29 DIAGNOSIS — R632 Polyphagia: Secondary | ICD-10-CM | POA: Diagnosis not present

## 2022-06-29 DIAGNOSIS — R110 Nausea: Secondary | ICD-10-CM | POA: Diagnosis not present

## 2022-06-29 DIAGNOSIS — E559 Vitamin D deficiency, unspecified: Secondary | ICD-10-CM | POA: Diagnosis not present

## 2022-06-29 DIAGNOSIS — R109 Unspecified abdominal pain: Secondary | ICD-10-CM | POA: Diagnosis not present

## 2022-06-29 MED ORDER — ONDANSETRON HCL 4 MG PO TABS
4.0000 mg | ORAL_TABLET | Freq: Four times a day (QID) | ORAL | 0 refills | Status: AC | PRN
  Filled 2022-06-29: qty 20, 5d supply, fill #0

## 2022-07-06 ENCOUNTER — Other Ambulatory Visit (HOSPITAL_COMMUNITY): Payer: Self-pay

## 2022-07-13 ENCOUNTER — Other Ambulatory Visit: Payer: Self-pay

## 2022-08-14 ENCOUNTER — Other Ambulatory Visit: Payer: Self-pay | Admitting: Sports Medicine

## 2022-08-14 ENCOUNTER — Other Ambulatory Visit (HOSPITAL_COMMUNITY): Payer: Self-pay

## 2022-08-14 ENCOUNTER — Other Ambulatory Visit: Payer: Self-pay

## 2022-08-14 MED ORDER — TERBINAFINE HCL 250 MG PO TABS
250.0000 mg | ORAL_TABLET | Freq: Every day | ORAL | 1 refills | Status: DC
  Filled 2022-08-14: qty 30, 30d supply, fill #0
  Filled 2022-09-10: qty 30, 30d supply, fill #1

## 2022-09-11 ENCOUNTER — Other Ambulatory Visit: Payer: Self-pay

## 2022-10-10 DIAGNOSIS — E663 Overweight: Secondary | ICD-10-CM | POA: Diagnosis not present

## 2022-10-10 DIAGNOSIS — R632 Polyphagia: Secondary | ICD-10-CM | POA: Diagnosis not present

## 2022-10-10 DIAGNOSIS — R7303 Prediabetes: Secondary | ICD-10-CM | POA: Diagnosis not present

## 2022-10-10 DIAGNOSIS — E559 Vitamin D deficiency, unspecified: Secondary | ICD-10-CM | POA: Diagnosis not present

## 2022-10-11 ENCOUNTER — Other Ambulatory Visit (HOSPITAL_COMMUNITY): Payer: Self-pay

## 2022-10-11 MED ORDER — METFORMIN HCL ER 500 MG PO TB24
500.0000 mg | ORAL_TABLET | Freq: Every evening | ORAL | 1 refills | Status: DC
  Filled 2022-10-11: qty 30, 30d supply, fill #0

## 2022-10-16 ENCOUNTER — Other Ambulatory Visit: Payer: Self-pay | Admitting: Sports Medicine

## 2022-10-19 ENCOUNTER — Other Ambulatory Visit (HOSPITAL_COMMUNITY): Payer: Self-pay

## 2022-10-19 ENCOUNTER — Other Ambulatory Visit: Payer: Self-pay | Admitting: Sports Medicine

## 2022-10-19 ENCOUNTER — Other Ambulatory Visit: Payer: Self-pay

## 2022-10-19 MED ORDER — TERBINAFINE HCL 250 MG PO TABS
250.0000 mg | ORAL_TABLET | Freq: Every day | ORAL | 1 refills | Status: DC
  Filled 2022-10-19: qty 30, 30d supply, fill #0
  Filled 2022-11-14: qty 30, 30d supply, fill #1

## 2022-11-06 ENCOUNTER — Other Ambulatory Visit (HOSPITAL_COMMUNITY): Payer: Self-pay

## 2022-11-13 DIAGNOSIS — L814 Other melanin hyperpigmentation: Secondary | ICD-10-CM | POA: Diagnosis not present

## 2022-11-13 DIAGNOSIS — L821 Other seborrheic keratosis: Secondary | ICD-10-CM | POA: Diagnosis not present

## 2022-11-13 DIAGNOSIS — D1801 Hemangioma of skin and subcutaneous tissue: Secondary | ICD-10-CM | POA: Diagnosis not present

## 2022-11-13 DIAGNOSIS — D2271 Melanocytic nevi of right lower limb, including hip: Secondary | ICD-10-CM | POA: Diagnosis not present

## 2022-11-13 DIAGNOSIS — D2272 Melanocytic nevi of left lower limb, including hip: Secondary | ICD-10-CM | POA: Diagnosis not present

## 2022-11-13 DIAGNOSIS — D225 Melanocytic nevi of trunk: Secondary | ICD-10-CM | POA: Diagnosis not present

## 2022-11-14 ENCOUNTER — Other Ambulatory Visit (HOSPITAL_COMMUNITY): Payer: Self-pay

## 2022-12-27 DIAGNOSIS — R220 Localized swelling, mass and lump, head: Secondary | ICD-10-CM | POA: Diagnosis not present

## 2022-12-27 DIAGNOSIS — B001 Herpesviral vesicular dermatitis: Secondary | ICD-10-CM | POA: Diagnosis not present

## 2023-01-01 ENCOUNTER — Other Ambulatory Visit: Payer: Self-pay | Admitting: *Deleted

## 2023-01-01 ENCOUNTER — Other Ambulatory Visit (HOSPITAL_COMMUNITY): Payer: Self-pay

## 2023-01-01 ENCOUNTER — Other Ambulatory Visit: Payer: Self-pay

## 2023-01-01 MED ORDER — TERBINAFINE HCL 250 MG PO TABS
250.0000 mg | ORAL_TABLET | Freq: Every day | ORAL | 1 refills | Status: DC
  Filled 2023-01-01: qty 30, 30d supply, fill #0
  Filled 2023-01-29: qty 30, 30d supply, fill #1

## 2023-01-02 ENCOUNTER — Other Ambulatory Visit (HOSPITAL_COMMUNITY): Payer: Self-pay

## 2023-01-02 MED ORDER — VALACYCLOVIR HCL 1 G PO TABS
2.0000 g | ORAL_TABLET | Freq: Two times a day (BID) | ORAL | 0 refills | Status: DC
  Filled 2023-01-02: qty 30, 8d supply, fill #0

## 2023-01-17 DIAGNOSIS — J019 Acute sinusitis, unspecified: Secondary | ICD-10-CM | POA: Diagnosis not present

## 2023-01-17 DIAGNOSIS — R051 Acute cough: Secondary | ICD-10-CM | POA: Diagnosis not present

## 2023-01-17 DIAGNOSIS — B9689 Other specified bacterial agents as the cause of diseases classified elsewhere: Secondary | ICD-10-CM | POA: Diagnosis not present

## 2023-01-29 ENCOUNTER — Other Ambulatory Visit (HOSPITAL_COMMUNITY): Payer: Self-pay

## 2023-01-31 ENCOUNTER — Other Ambulatory Visit: Payer: Self-pay | Admitting: Oncology

## 2023-01-31 DIAGNOSIS — Z006 Encounter for examination for normal comparison and control in clinical research program: Secondary | ICD-10-CM

## 2023-02-28 ENCOUNTER — Other Ambulatory Visit (HOSPITAL_COMMUNITY)
Admission: RE | Admit: 2023-02-28 | Discharge: 2023-02-28 | Disposition: A | Discharge status: Home / Self Care | Payer: Commercial Managed Care - PPO | Attending: Oncology | Admitting: Oncology

## 2023-02-28 DIAGNOSIS — Z6826 Body mass index (BMI) 26.0-26.9, adult: Secondary | ICD-10-CM | POA: Diagnosis not present

## 2023-02-28 DIAGNOSIS — Z0000 Encounter for general adult medical examination without abnormal findings: Secondary | ICD-10-CM | POA: Diagnosis not present

## 2023-02-28 DIAGNOSIS — N951 Menopausal and female climacteric states: Secondary | ICD-10-CM | POA: Diagnosis not present

## 2023-02-28 DIAGNOSIS — R7303 Prediabetes: Secondary | ICD-10-CM | POA: Diagnosis not present

## 2023-02-28 DIAGNOSIS — Z006 Encounter for examination for normal comparison and control in clinical research program: Secondary | ICD-10-CM | POA: Insufficient documentation

## 2023-03-13 LAB — GENECONNECT MOLECULAR SCREEN: Genetic Analysis Overall Interpretation: NEGATIVE

## 2023-03-20 DIAGNOSIS — H5203 Hypermetropia, bilateral: Secondary | ICD-10-CM | POA: Diagnosis not present

## 2023-03-27 ENCOUNTER — Telehealth: Payer: Commercial Managed Care - PPO | Admitting: Physician Assistant

## 2023-03-27 DIAGNOSIS — B9689 Other specified bacterial agents as the cause of diseases classified elsewhere: Secondary | ICD-10-CM

## 2023-03-27 DIAGNOSIS — J019 Acute sinusitis, unspecified: Secondary | ICD-10-CM | POA: Diagnosis not present

## 2023-03-27 MED ORDER — AMOXICILLIN-POT CLAVULANATE 875-125 MG PO TABS
1.0000 | ORAL_TABLET | Freq: Two times a day (BID) | ORAL | 0 refills | Status: AC
Start: 2023-03-27 — End: ?

## 2023-03-27 NOTE — Progress Notes (Signed)
I have spent 5 minutes in review of e-visit questionnaire, review and updating patient chart, medical decision making and response to patient.   William Cody Martin, PA-C    

## 2023-03-27 NOTE — Progress Notes (Signed)

## 2023-04-11 ENCOUNTER — Other Ambulatory Visit: Payer: Self-pay | Admitting: Family Medicine

## 2023-04-11 DIAGNOSIS — Z1231 Encounter for screening mammogram for malignant neoplasm of breast: Secondary | ICD-10-CM

## 2023-04-18 DIAGNOSIS — E663 Overweight: Secondary | ICD-10-CM | POA: Diagnosis not present

## 2023-04-18 DIAGNOSIS — R7303 Prediabetes: Secondary | ICD-10-CM | POA: Diagnosis not present

## 2023-04-18 DIAGNOSIS — R632 Polyphagia: Secondary | ICD-10-CM | POA: Diagnosis not present

## 2023-04-18 DIAGNOSIS — E559 Vitamin D deficiency, unspecified: Secondary | ICD-10-CM | POA: Diagnosis not present

## 2023-04-19 DIAGNOSIS — J019 Acute sinusitis, unspecified: Secondary | ICD-10-CM | POA: Diagnosis not present

## 2023-05-08 ENCOUNTER — Other Ambulatory Visit (HOSPITAL_COMMUNITY): Payer: Self-pay

## 2023-05-08 ENCOUNTER — Other Ambulatory Visit: Payer: Self-pay

## 2023-05-08 MED ORDER — CYCLOBENZAPRINE HCL 10 MG PO TABS
10.0000 mg | ORAL_TABLET | Freq: Three times a day (TID) | ORAL | 0 refills | Status: AC
  Filled 2023-05-08: qty 30, 10d supply, fill #0

## 2023-05-17 ENCOUNTER — Other Ambulatory Visit (HOSPITAL_COMMUNITY): Payer: Self-pay

## 2023-05-17 ENCOUNTER — Other Ambulatory Visit: Payer: Self-pay

## 2023-05-17 DIAGNOSIS — Z6825 Body mass index (BMI) 25.0-25.9, adult: Secondary | ICD-10-CM | POA: Diagnosis not present

## 2023-05-17 DIAGNOSIS — E663 Overweight: Secondary | ICD-10-CM | POA: Diagnosis not present

## 2023-05-17 DIAGNOSIS — R7303 Prediabetes: Secondary | ICD-10-CM | POA: Diagnosis not present

## 2023-05-17 DIAGNOSIS — R632 Polyphagia: Secondary | ICD-10-CM | POA: Diagnosis not present

## 2023-05-17 DIAGNOSIS — E559 Vitamin D deficiency, unspecified: Secondary | ICD-10-CM | POA: Diagnosis not present

## 2023-05-17 MED ORDER — METFORMIN HCL ER 500 MG PO TB24
500.0000 mg | ORAL_TABLET | Freq: Every day | ORAL | 2 refills | Status: DC
  Filled 2023-05-17: qty 30, 30d supply, fill #0
  Filled 2023-06-11: qty 30, 30d supply, fill #1

## 2023-05-17 MED ORDER — TOPIRAMATE 25 MG PO TABS
25.0000 mg | ORAL_TABLET | Freq: Every day | ORAL | 1 refills | Status: DC
  Filled 2023-05-17: qty 30, 30d supply, fill #0
  Filled 2023-06-11: qty 30, 30d supply, fill #1

## 2023-06-11 ENCOUNTER — Other Ambulatory Visit (HOSPITAL_COMMUNITY): Payer: Self-pay

## 2023-06-12 ENCOUNTER — Ambulatory Visit
Admission: RE | Admit: 2023-06-12 | Discharge: 2023-06-12 | Disposition: A | Discharge status: Home / Self Care | Payer: Commercial Managed Care - PPO | Source: Ambulatory Visit | Attending: Family Medicine | Admitting: Family Medicine

## 2023-06-12 DIAGNOSIS — Z1231 Encounter for screening mammogram for malignant neoplasm of breast: Secondary | ICD-10-CM | POA: Diagnosis not present

## 2023-08-15 IMAGING — MG MM DIGITAL SCREENING BILAT W/ TOMO AND CAD
8 series · 9 of 24 positions shown · non-contrast
Comparison: Previous exam(s).

CLINICAL DATA: Screening.

EXAM:
DIGITAL SCREENING BILATERAL MAMMOGRAM WITH TOMOSYNTHESIS AND CAD
TECHNIQUE: Bilateral screening digital craniocaudal and mediolateral oblique
mammograms were obtained. Bilateral screening digital breast
tomosynthesis was performed. The images were evaluated with
computer-aided detection.

[R CC synth-2D]
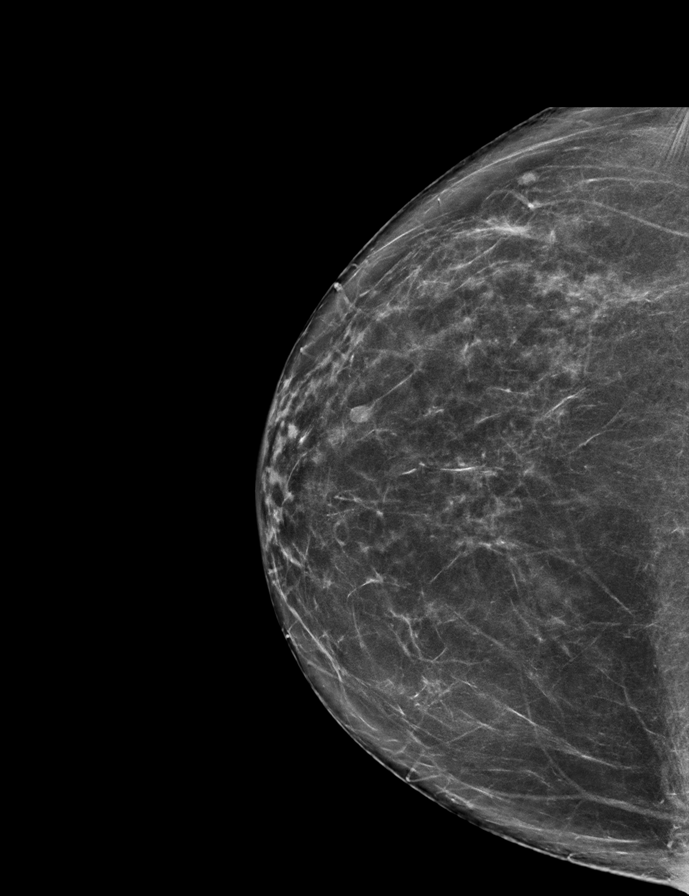

[L CC synth-2D]
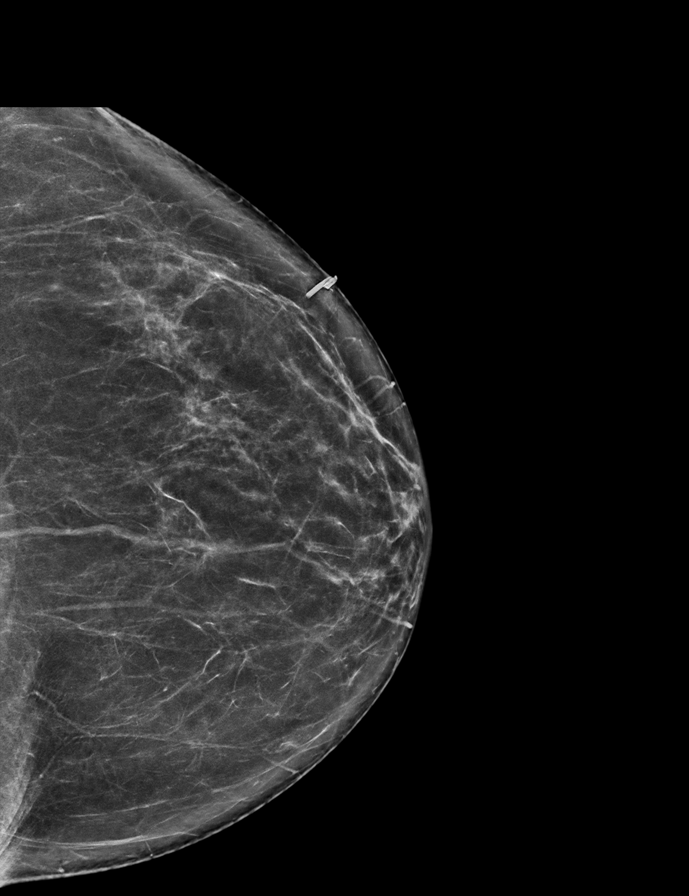

[R MLO synth-2D]
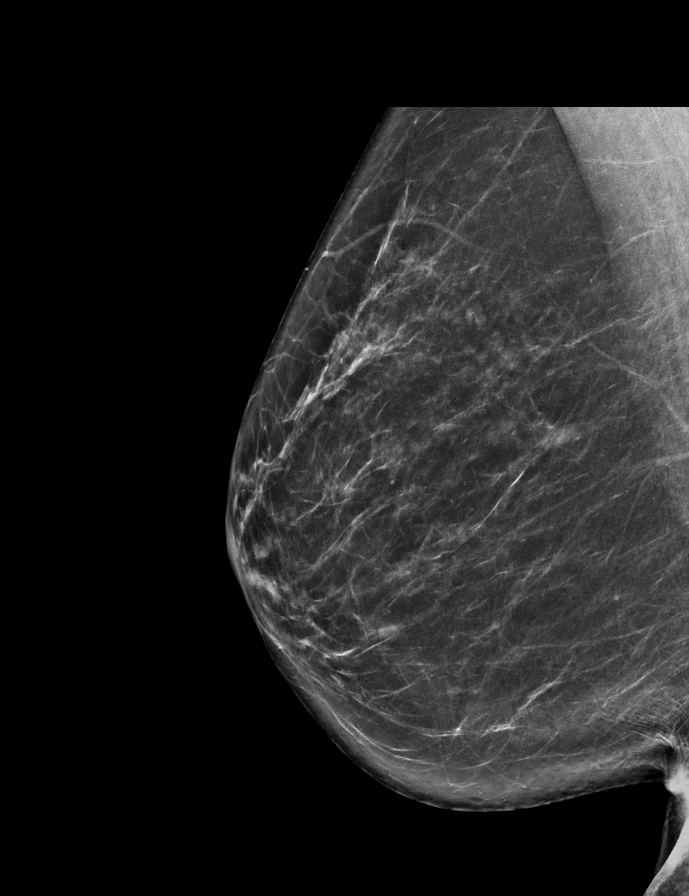

[L MLO synth-2D]
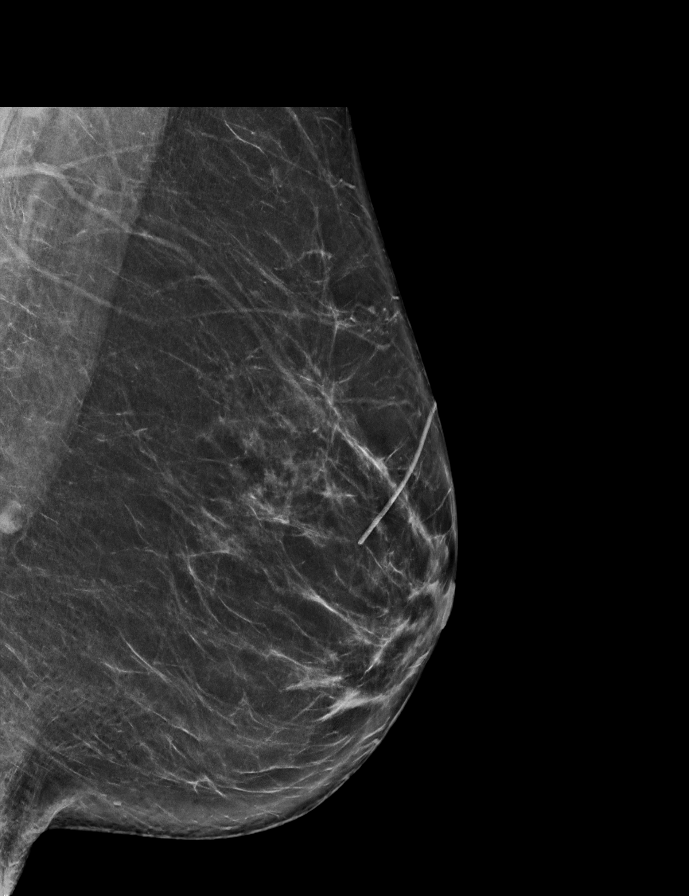

[L CC tomo · 2 of 75 frames shown]
[frame 25/75]
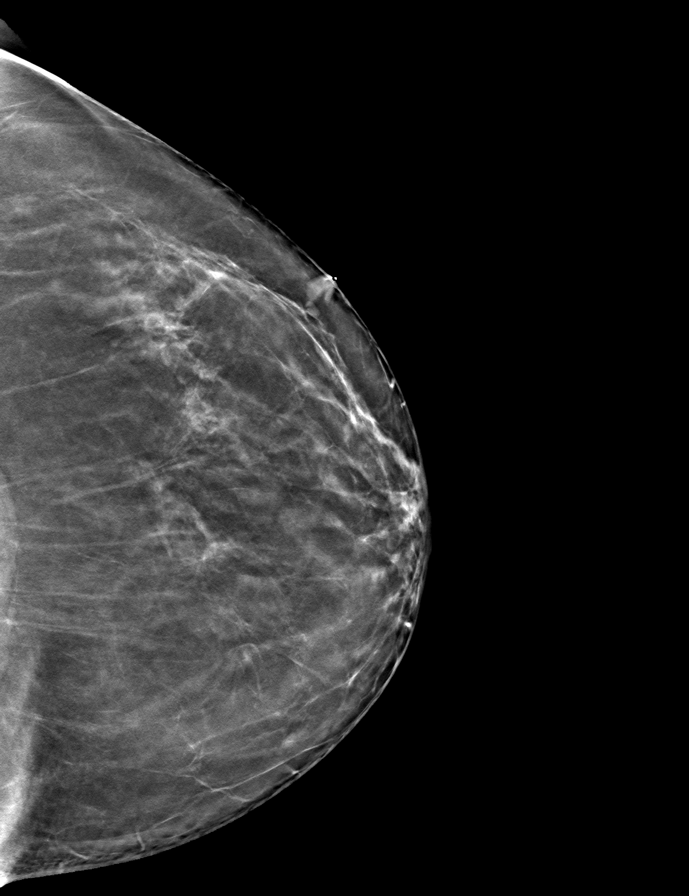
[frame 38/75]
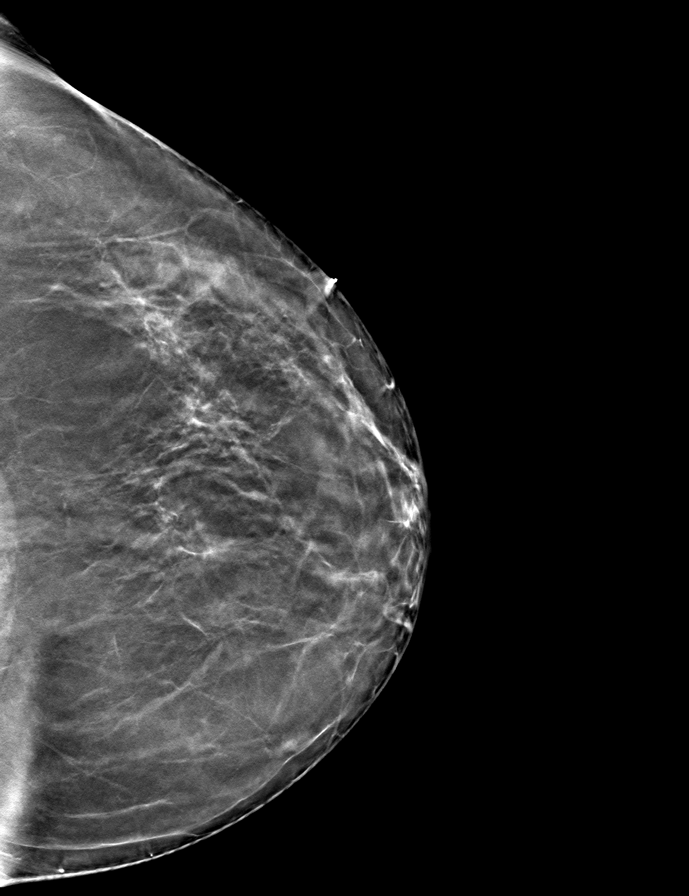

[R MLO tomo · tomo slice 39/77.0]
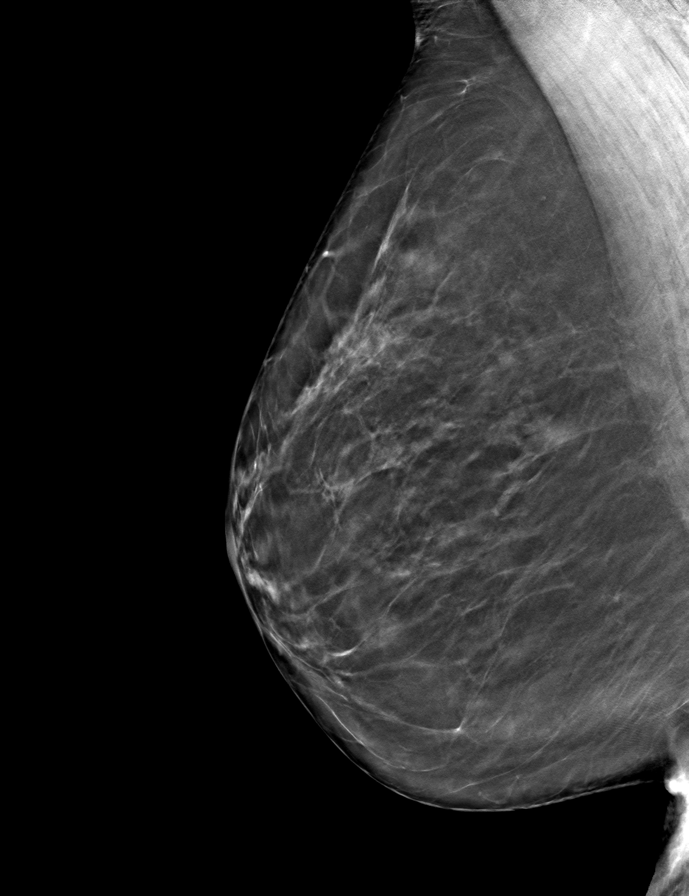

[L MLO tomo · tomo slice 41/80.0]
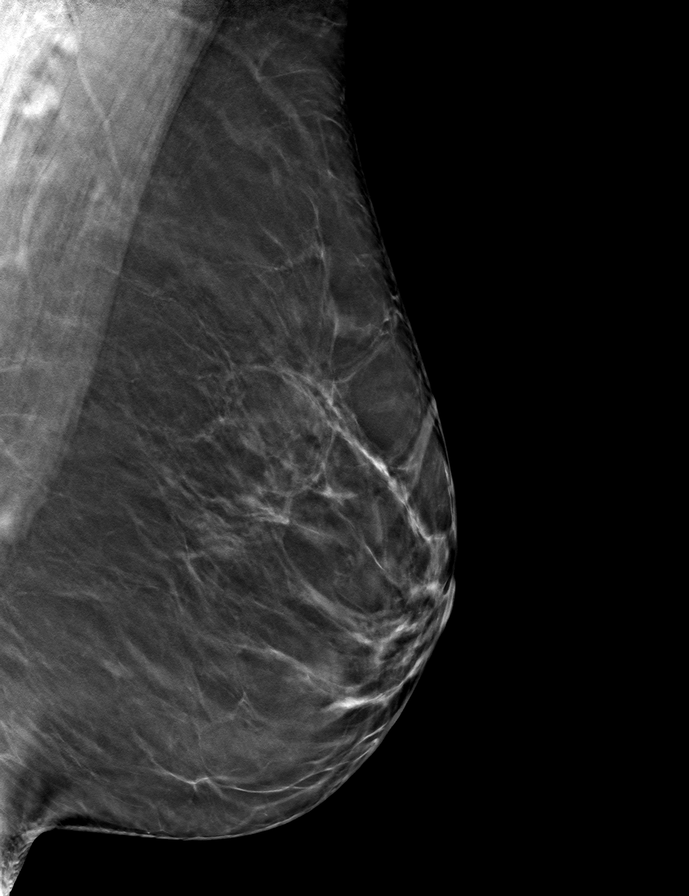

[R CC tomo · tomo slice 37/74.0]
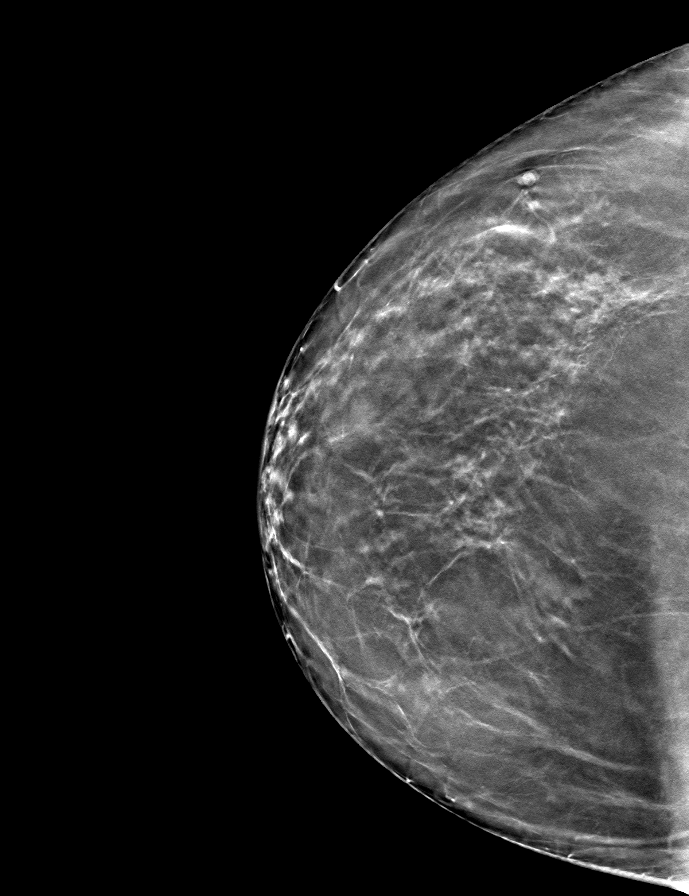

[9 of 24 positions shown; findings below may reference images not displayed]

ACR Breast Density Category b: There are scattered areas of
fibroglandular density.
FINDINGS: There are no findings suspicious for malignancy.
IMPRESSION: No mammographic evidence of malignancy. A result letter of this
screening mammogram will be mailed directly to the patient.

RECOMMENDATION:
Screening mammogram in one year. (Code:51-O-LD2)

BI-RADS CATEGORY  1: Negative.

## 2023-08-30 ENCOUNTER — Other Ambulatory Visit (HOSPITAL_COMMUNITY): Payer: Self-pay

## 2023-08-30 DIAGNOSIS — E663 Overweight: Secondary | ICD-10-CM | POA: Diagnosis not present

## 2023-08-30 DIAGNOSIS — E559 Vitamin D deficiency, unspecified: Secondary | ICD-10-CM | POA: Diagnosis not present

## 2023-08-30 DIAGNOSIS — R7303 Prediabetes: Secondary | ICD-10-CM | POA: Diagnosis not present

## 2023-08-30 DIAGNOSIS — Z6825 Body mass index (BMI) 25.0-25.9, adult: Secondary | ICD-10-CM | POA: Diagnosis not present

## 2023-08-30 DIAGNOSIS — R632 Polyphagia: Secondary | ICD-10-CM | POA: Diagnosis not present

## 2023-08-30 MED ORDER — METFORMIN HCL ER 500 MG PO TB24
500.0000 mg | ORAL_TABLET | Freq: Every evening | ORAL | 2 refills | Status: AC
  Filled 2023-08-30: qty 30, 30d supply, fill #0
  Filled 2024-08-07: qty 60, 60d supply, fill #1

## 2023-08-30 MED ORDER — TOPIRAMATE 25 MG PO TABS
25.0000 mg | ORAL_TABLET | Freq: Every day | ORAL | 1 refills | Status: DC
  Filled 2023-08-30: qty 30, 30d supply, fill #0

## 2023-11-13 DIAGNOSIS — H5203 Hypermetropia, bilateral: Secondary | ICD-10-CM | POA: Diagnosis not present

## 2024-01-21 DIAGNOSIS — Z6825 Body mass index (BMI) 25.0-25.9, adult: Secondary | ICD-10-CM | POA: Diagnosis not present

## 2024-01-21 DIAGNOSIS — R7303 Prediabetes: Secondary | ICD-10-CM | POA: Diagnosis not present

## 2024-01-21 DIAGNOSIS — E663 Overweight: Secondary | ICD-10-CM | POA: Diagnosis not present

## 2024-01-25 DIAGNOSIS — D225 Melanocytic nevi of trunk: Secondary | ICD-10-CM | POA: Diagnosis not present

## 2024-01-25 DIAGNOSIS — L84 Corns and callosities: Secondary | ICD-10-CM | POA: Diagnosis not present

## 2024-01-25 DIAGNOSIS — D2261 Melanocytic nevi of right upper limb, including shoulder: Secondary | ICD-10-CM | POA: Diagnosis not present

## 2024-01-25 DIAGNOSIS — D1801 Hemangioma of skin and subcutaneous tissue: Secondary | ICD-10-CM | POA: Diagnosis not present

## 2024-01-25 DIAGNOSIS — L738 Other specified follicular disorders: Secondary | ICD-10-CM | POA: Diagnosis not present

## 2024-01-25 DIAGNOSIS — L821 Other seborrheic keratosis: Secondary | ICD-10-CM | POA: Diagnosis not present

## 2024-01-29 ENCOUNTER — Telehealth: Admitting: Physician Assistant

## 2024-01-29 DIAGNOSIS — J329 Chronic sinusitis, unspecified: Secondary | ICD-10-CM

## 2024-01-29 MED ORDER — FLUCONAZOLE 150 MG PO TABS: ORAL_TABLET | ORAL | 0 refills | Status: AC

## 2024-01-29 MED ORDER — AMOXICILLIN-POT CLAVULANATE 875-125 MG PO TABS: 1.0000 | ORAL_TABLET | Freq: Two times a day (BID) | ORAL | 0 refills | Status: AC

## 2024-01-29 NOTE — Progress Notes (Signed)

## 2024-01-29 NOTE — Addendum Note (Signed)
 Addended by: GLADIS ELSIE BROCKS on: 01/29/2024 03:38 PM   Modules accepted: Orders

## 2024-03-03 DIAGNOSIS — Z0000 Encounter for general adult medical examination without abnormal findings: Secondary | ICD-10-CM | POA: Diagnosis not present

## 2024-03-03 DIAGNOSIS — Z6826 Body mass index (BMI) 26.0-26.9, adult: Secondary | ICD-10-CM | POA: Diagnosis not present

## 2024-03-03 DIAGNOSIS — E663 Overweight: Secondary | ICD-10-CM | POA: Diagnosis not present

## 2024-03-03 DIAGNOSIS — R7303 Prediabetes: Secondary | ICD-10-CM | POA: Diagnosis not present

## 2024-03-03 DIAGNOSIS — B009 Herpesviral infection, unspecified: Secondary | ICD-10-CM | POA: Diagnosis not present

## 2024-03-20 DIAGNOSIS — R7303 Prediabetes: Secondary | ICD-10-CM | POA: Diagnosis not present

## 2024-03-20 DIAGNOSIS — Z6825 Body mass index (BMI) 25.0-25.9, adult: Secondary | ICD-10-CM | POA: Diagnosis not present

## 2024-03-20 DIAGNOSIS — E663 Overweight: Secondary | ICD-10-CM | POA: Diagnosis not present

## 2024-05-06 ENCOUNTER — Other Ambulatory Visit: Payer: Self-pay | Admitting: Family Medicine

## 2024-05-06 DIAGNOSIS — Z1231 Encounter for screening mammogram for malignant neoplasm of breast: Secondary | ICD-10-CM

## 2024-05-07 DIAGNOSIS — E663 Overweight: Secondary | ICD-10-CM | POA: Diagnosis not present

## 2024-05-07 DIAGNOSIS — Z6825 Body mass index (BMI) 25.0-25.9, adult: Secondary | ICD-10-CM | POA: Diagnosis not present

## 2024-05-07 DIAGNOSIS — R635 Abnormal weight gain: Secondary | ICD-10-CM | POA: Diagnosis not present

## 2024-05-07 DIAGNOSIS — R7303 Prediabetes: Secondary | ICD-10-CM | POA: Diagnosis not present

## 2024-06-17 ENCOUNTER — Ambulatory Visit
Admission: RE | Admit: 2024-06-17 | Discharge: 2024-06-17 | Disposition: A | Source: Ambulatory Visit | Attending: Family Medicine | Admitting: Family Medicine

## 2024-06-17 DIAGNOSIS — Z1231 Encounter for screening mammogram for malignant neoplasm of breast: Secondary | ICD-10-CM

## 2024-06-26 ENCOUNTER — Other Ambulatory Visit (HOSPITAL_COMMUNITY): Payer: Self-pay

## 2024-07-02 DIAGNOSIS — E663 Overweight: Secondary | ICD-10-CM | POA: Diagnosis not present

## 2024-07-02 DIAGNOSIS — R7303 Prediabetes: Secondary | ICD-10-CM | POA: Diagnosis not present

## 2024-07-02 DIAGNOSIS — Z6825 Body mass index (BMI) 25.0-25.9, adult: Secondary | ICD-10-CM | POA: Diagnosis not present

## 2024-07-02 DIAGNOSIS — R635 Abnormal weight gain: Secondary | ICD-10-CM | POA: Diagnosis not present

## 2024-08-07 ENCOUNTER — Other Ambulatory Visit (HOSPITAL_COMMUNITY): Payer: Self-pay

## 2024-08-08 ENCOUNTER — Other Ambulatory Visit (HOSPITAL_COMMUNITY): Payer: Self-pay

## 2024-08-08 MED ORDER — METFORMIN HCL ER 500 MG PO TB24
500.0000 mg | ORAL_TABLET | Freq: Every day | ORAL | 1 refills | Status: AC
  Filled 2024-08-08: qty 30, 30d supply, fill #0

## 2024-08-12 ENCOUNTER — Other Ambulatory Visit (HOSPITAL_COMMUNITY): Payer: Self-pay

## 2024-08-12 MED ORDER — VALACYCLOVIR HCL 1 G PO TABS
2000.0000 mg | ORAL_TABLET | Freq: Two times a day (BID) | ORAL | 1 refills | Status: DC
  Filled 2024-08-12: qty 30, 8d supply, fill #0

## 2024-08-13 ENCOUNTER — Other Ambulatory Visit (HOSPITAL_COMMUNITY): Payer: Self-pay

## 2024-08-13 ENCOUNTER — Other Ambulatory Visit: Payer: Self-pay

## 2024-08-13 MED ORDER — OZEMPIC (0.25 OR 0.5 MG/DOSE) 2 MG/3ML ~~LOC~~ SOPN
0.5000 mg | PEN_INJECTOR | SUBCUTANEOUS | 1 refills | Status: AC
  Filled 2024-08-13: qty 3, 28d supply, fill #0

## 2024-08-14 ENCOUNTER — Other Ambulatory Visit (HOSPITAL_COMMUNITY): Payer: Self-pay

## 2024-08-14 MED ORDER — VALACYCLOVIR HCL 1 G PO TABS
2000.0000 mg | ORAL_TABLET | Freq: Once | ORAL | 1 refills | Status: AC | PRN
  Filled 2024-08-14 (×2): qty 30, 8d supply, fill #0
  Filled 2024-08-14: qty 30, 15d supply, fill #0

## 2024-08-14 MED ORDER — OZEMPIC (1 MG/DOSE) 4 MG/3ML ~~LOC~~ SOPN
1.0000 mg | PEN_INJECTOR | SUBCUTANEOUS | 1 refills | Status: AC
  Filled 2024-08-14 (×2): qty 3, 28d supply, fill #0

## 2024-08-19 ENCOUNTER — Other Ambulatory Visit (HOSPITAL_COMMUNITY): Payer: Self-pay

## 2024-08-19 MED ORDER — VALACYCLOVIR HCL 1 G PO TABS
2.0000 g | ORAL_TABLET | Freq: Two times a day (BID) | ORAL | 0 refills | Status: AC
  Filled 2024-08-19: qty 90, 90d supply, fill #0

## 2024-08-20 ENCOUNTER — Other Ambulatory Visit (HOSPITAL_COMMUNITY): Payer: Self-pay

## 2024-08-22 ENCOUNTER — Other Ambulatory Visit (HOSPITAL_COMMUNITY): Payer: Self-pay
